# Patient Record
Sex: Male | Born: 1955 | Hispanic: Yes | Marital: Married | Smoking: Former smoker
Health system: Southern US, Community
[De-identification: ages and names within clinical notes are randomized; demographics above are authoritative.]

## PROBLEM LIST (undated history)

## (undated) DIAGNOSIS — E7800 Pure hypercholesterolemia, unspecified: Secondary | ICD-10-CM

## (undated) DIAGNOSIS — C61 Malignant neoplasm of prostate: Secondary | ICD-10-CM

## (undated) DIAGNOSIS — I10 Essential (primary) hypertension: Secondary | ICD-10-CM

## (undated) HISTORY — DX: Pure hypercholesterolemia, unspecified: E78.00

## (undated) HISTORY — DX: Malignant neoplasm of prostate: C61

## (undated) HISTORY — PX: CARDIAC CATHETERIZATION: SHX172

## (undated) HISTORY — DX: Essential (primary) hypertension: I10

---

## 2003-12-18 ENCOUNTER — Ambulatory Visit: Payer: Self-pay

## 2005-05-29 ENCOUNTER — Other Ambulatory Visit: Payer: Self-pay

## 2005-05-29 ENCOUNTER — Emergency Department: Payer: Self-pay | Admitting: Emergency Medicine

## 2005-06-01 ENCOUNTER — Other Ambulatory Visit: Payer: Self-pay

## 2005-06-01 ENCOUNTER — Emergency Department: Payer: Self-pay | Admitting: General Practice

## 2005-06-01 ENCOUNTER — Inpatient Hospital Stay: Payer: Self-pay | Admitting: Internal Medicine

## 2005-06-02 ENCOUNTER — Other Ambulatory Visit: Payer: Self-pay

## 2005-06-03 ENCOUNTER — Other Ambulatory Visit: Payer: Self-pay

## 2005-06-13 ENCOUNTER — Ambulatory Visit: Payer: Self-pay | Admitting: Internal Medicine

## 2005-06-15 ENCOUNTER — Inpatient Hospital Stay: Payer: Self-pay | Admitting: Internal Medicine

## 2005-06-15 ENCOUNTER — Other Ambulatory Visit: Payer: Self-pay

## 2006-03-26 ENCOUNTER — Ambulatory Visit: Payer: Self-pay | Admitting: Internal Medicine

## 2006-05-22 ENCOUNTER — Ambulatory Visit: Payer: Self-pay

## 2007-03-06 ENCOUNTER — Ambulatory Visit: Payer: Self-pay | Admitting: Urology

## 2007-03-06 ENCOUNTER — Other Ambulatory Visit: Payer: Self-pay

## 2007-03-20 ENCOUNTER — Ambulatory Visit: Payer: Self-pay | Admitting: Urology

## 2007-11-27 ENCOUNTER — Encounter: Payer: Self-pay | Admitting: Sports Medicine

## 2007-12-24 ENCOUNTER — Encounter: Payer: Self-pay | Admitting: Sports Medicine

## 2008-02-07 ENCOUNTER — Ambulatory Visit: Payer: Self-pay | Admitting: Urology

## 2008-10-06 ENCOUNTER — Ambulatory Visit: Payer: Self-pay | Admitting: Internal Medicine

## 2008-12-26 ENCOUNTER — Emergency Department: Payer: Self-pay | Admitting: Emergency Medicine

## 2009-01-20 ENCOUNTER — Ambulatory Visit: Payer: Self-pay | Admitting: General Practice

## 2009-01-29 ENCOUNTER — Ambulatory Visit: Payer: Self-pay | Admitting: General Practice

## 2009-11-19 ENCOUNTER — Ambulatory Visit: Payer: Self-pay | Admitting: Otolaryngology

## 2011-05-09 ENCOUNTER — Observation Stay: Payer: Self-pay | Admitting: Internal Medicine

## 2011-05-09 LAB — CBC
HCT: 44.7 % (ref 40.0–52.0)
MCH: 31 pg (ref 26.0–34.0)
MCHC: 33.2 g/dL (ref 32.0–36.0)
Platelet: 114 10*3/uL — ABNORMAL LOW (ref 150–440)
RDW: 14.2 % (ref 11.5–14.5)
WBC: 4.3 10*3/uL (ref 3.8–10.6)

## 2011-05-09 LAB — COMPREHENSIVE METABOLIC PANEL
Albumin: 4 g/dL (ref 3.4–5.0)
Alkaline Phosphatase: 66 U/L (ref 50–136)
Calcium, Total: 8.4 mg/dL — ABNORMAL LOW (ref 8.5–10.1)
Co2: 25 mmol/L (ref 21–32)
Creatinine: 0.6 mg/dL (ref 0.60–1.30)
EGFR (Non-African Amer.): >60
Osmolality: 281 (ref 275–301)
Potassium: 3.7 mmol/L (ref 3.5–5.1)
SGPT (ALT): 37 U/L
Total Protein: 6.8 g/dL (ref 6.4–8.2)

## 2011-05-09 LAB — CK TOTAL AND CKMB (NOT AT ARMC)
CK, Total: 285 U/L — ABNORMAL HIGH (ref 35–232)
CK-MB: 4.4 ng/mL — ABNORMAL HIGH (ref 0.5–3.6)

## 2011-05-09 LAB — TROPONIN I: Troponin-I: <0.02 ng/mL

## 2011-05-09 LAB — HEPATIC FUNCTION PANEL A (ARMC): Bilirubin, Direct: 0.1 mg/dL (ref 0.00–0.20)

## 2011-05-17 ENCOUNTER — Ambulatory Visit: Payer: Self-pay | Admitting: Internal Medicine

## 2012-05-15 DIAGNOSIS — N411 Chronic prostatitis: Secondary | ICD-10-CM | POA: Insufficient documentation

## 2012-05-15 DIAGNOSIS — N529 Male erectile dysfunction, unspecified: Secondary | ICD-10-CM | POA: Insufficient documentation

## 2012-05-15 DIAGNOSIS — N401 Enlarged prostate with lower urinary tract symptoms: Secondary | ICD-10-CM | POA: Insufficient documentation

## 2012-05-16 DIAGNOSIS — N35919 Unspecified urethral stricture, male, unspecified site: Secondary | ICD-10-CM | POA: Insufficient documentation

## 2012-05-16 DIAGNOSIS — N3941 Urge incontinence: Secondary | ICD-10-CM | POA: Insufficient documentation

## 2012-06-11 ENCOUNTER — Ambulatory Visit: Payer: Self-pay | Admitting: Gastroenterology

## 2013-07-15 IMAGING — CR DG CHEST 2V
1 series · 3 of 3 positions shown · non-contrast
Comparison: none

REASON FOR EXAM: pain
COMMENTS:

PROCEDURE:     DXR - DXR CHEST PA (OR AP) AND LATERAL  - May 09, 2011  [DATE]
RESULT:     Comparison: None

[Series 1: pa · 0.17mm/px · 3 of 3 slices shown]
[im 1/3]
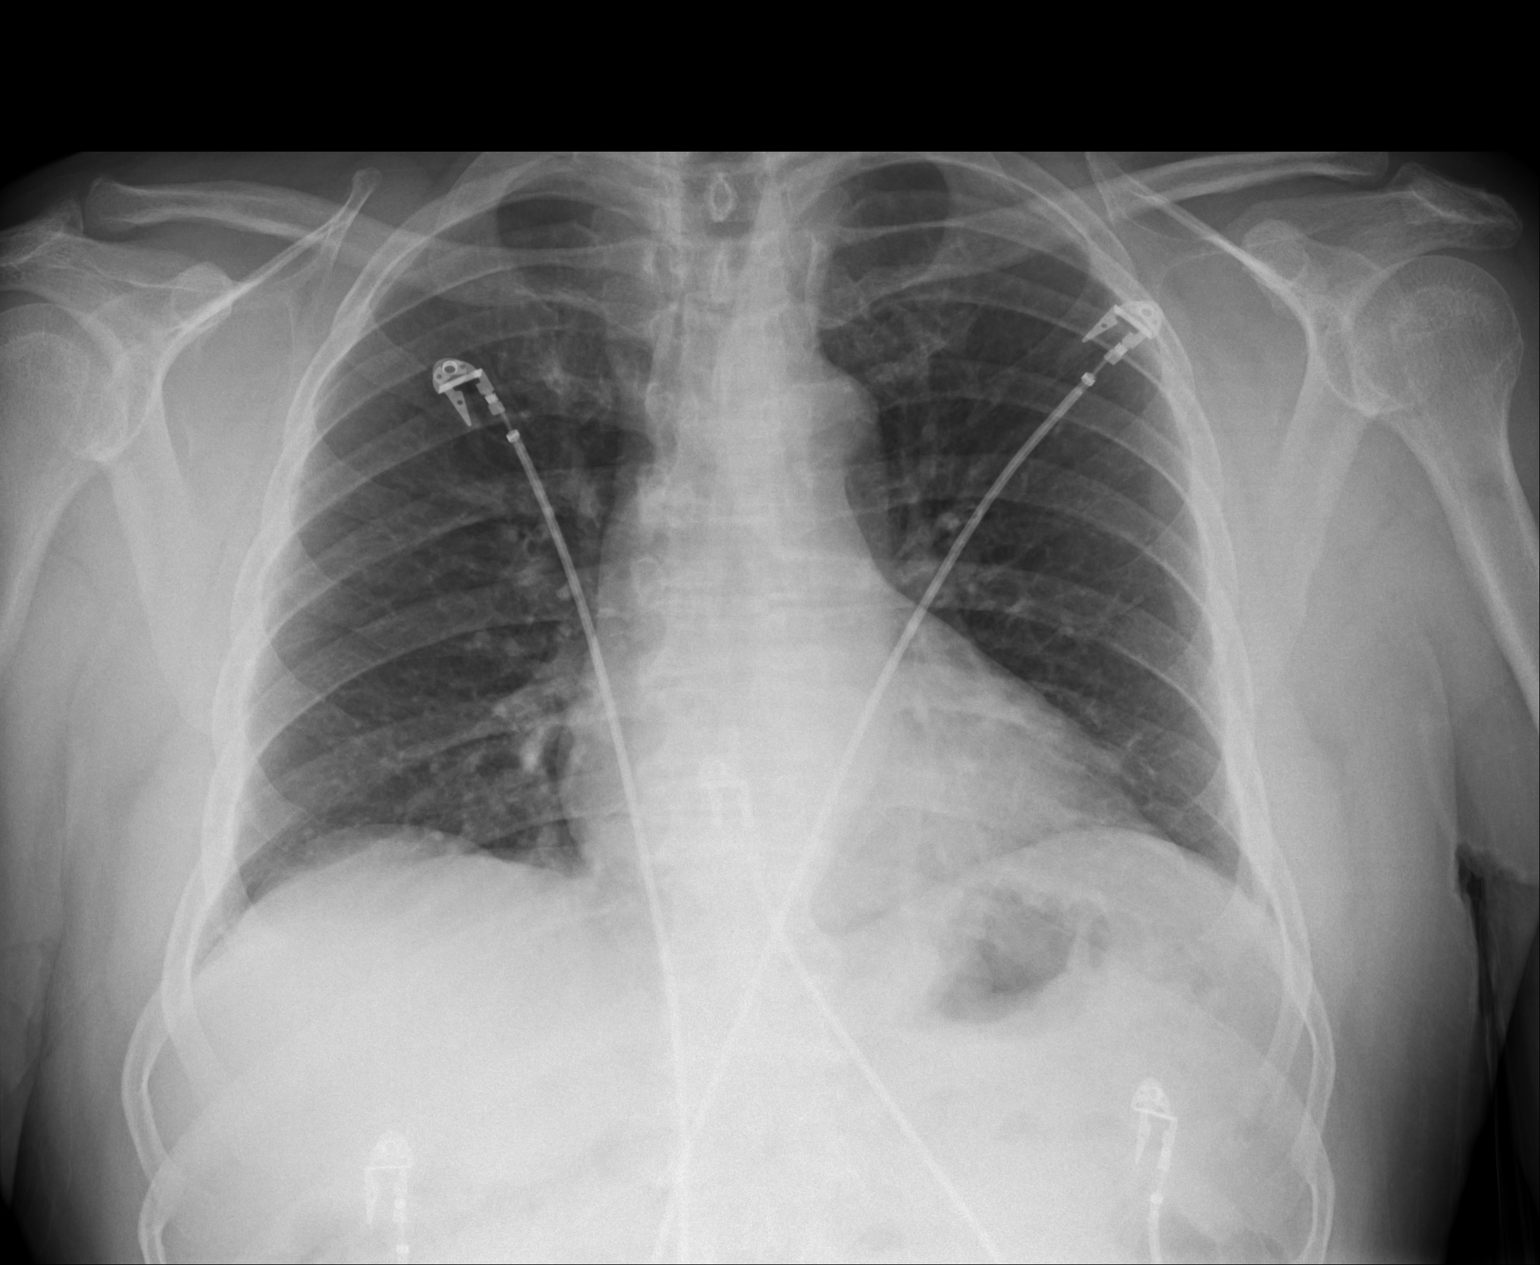
[im 2/3]
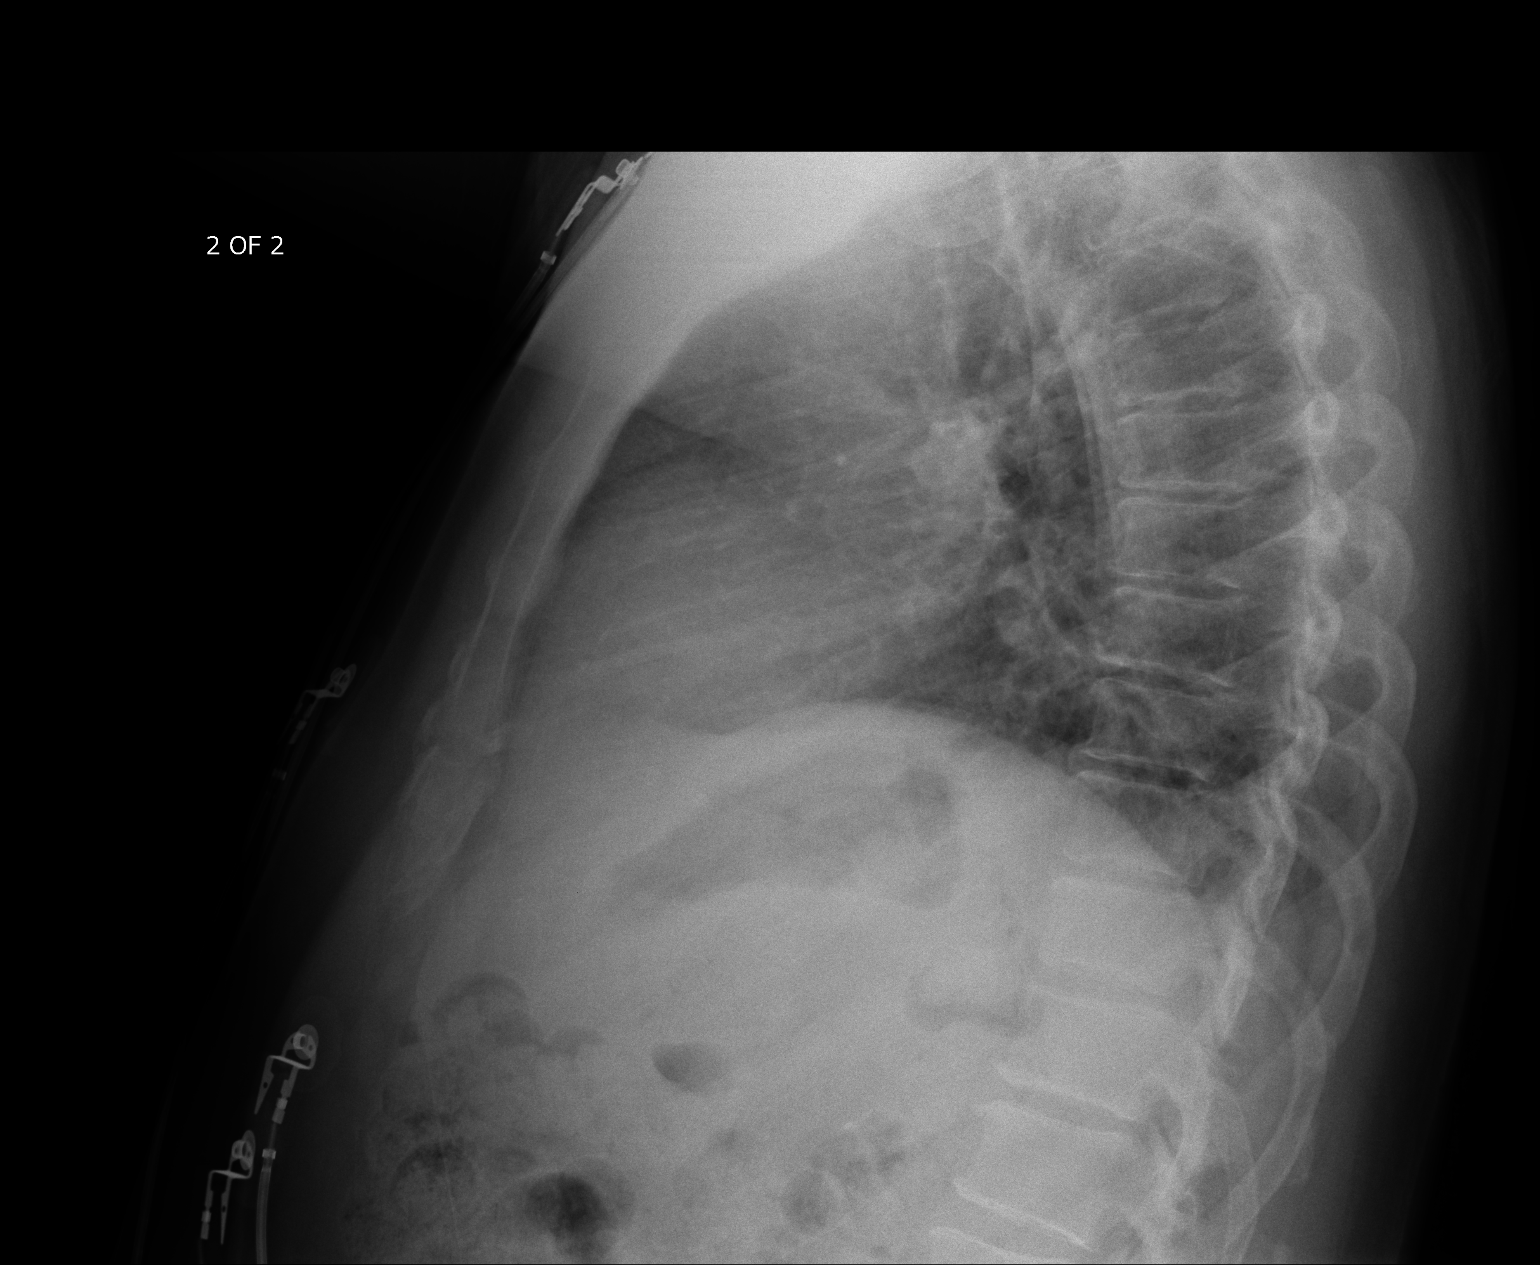
[im 3/3]
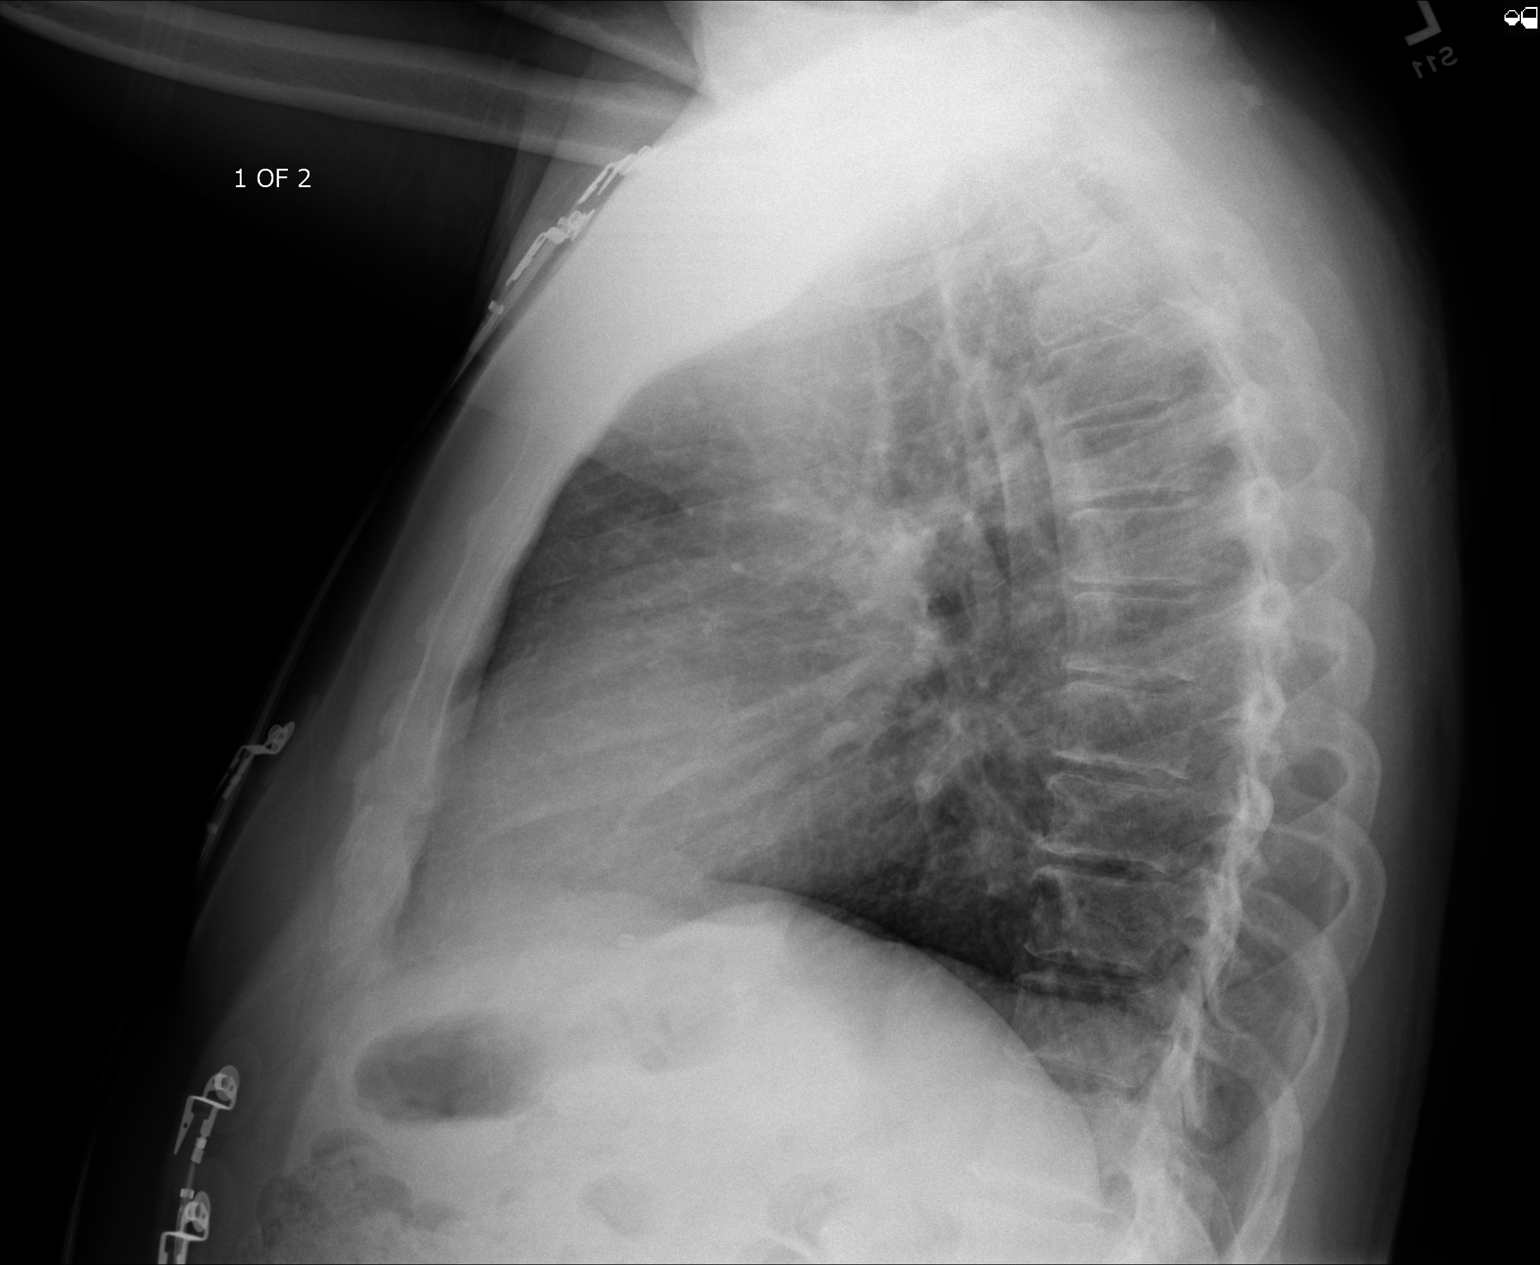

[3 of 3 positions shown; findings below may reference images not displayed]

FINDINGS: PA and lateral chest radiographs are provided.  There is no focal
parenchymal opacity, pleural effusion, or pneumothorax. The heart and
mediastinum are unremarkable.  The osseous structures are unremarkable.
IMPRESSION: No acute disease of the che[REDACTED]

## 2013-10-08 DIAGNOSIS — M179 Osteoarthritis of knee, unspecified: Secondary | ICD-10-CM | POA: Insufficient documentation

## 2013-10-08 DIAGNOSIS — M1710 Unilateral primary osteoarthritis, unspecified knee: Secondary | ICD-10-CM | POA: Insufficient documentation

## 2013-11-20 DIAGNOSIS — C61 Malignant neoplasm of prostate: Secondary | ICD-10-CM | POA: Insufficient documentation

## 2013-11-26 DIAGNOSIS — E559 Vitamin D deficiency, unspecified: Secondary | ICD-10-CM | POA: Insufficient documentation

## 2013-11-26 DIAGNOSIS — C61 Malignant neoplasm of prostate: Secondary | ICD-10-CM | POA: Insufficient documentation

## 2013-11-26 DIAGNOSIS — R7989 Other specified abnormal findings of blood chemistry: Secondary | ICD-10-CM | POA: Insufficient documentation

## 2014-01-29 ENCOUNTER — Ambulatory Visit: Payer: Self-pay | Admitting: Radiation Oncology

## 2014-02-23 ENCOUNTER — Ambulatory Visit: Payer: Self-pay | Admitting: Radiation Oncology

## 2014-03-24 ENCOUNTER — Ambulatory Visit
Admit: 2014-03-24 | Disposition: A | Discharge status: Home / Self Care | Payer: Self-pay | Attending: Radiation Oncology | Admitting: Radiation Oncology

## 2014-03-30 DIAGNOSIS — R739 Hyperglycemia, unspecified: Secondary | ICD-10-CM | POA: Insufficient documentation

## 2014-03-30 DIAGNOSIS — E7800 Pure hypercholesterolemia, unspecified: Secondary | ICD-10-CM | POA: Insufficient documentation

## 2014-04-24 ENCOUNTER — Ambulatory Visit
Admit: 2014-04-24 | Disposition: A | Discharge status: Home / Self Care | Payer: Self-pay | Attending: Radiation Oncology | Admitting: Radiation Oncology

## 2014-05-17 NOTE — Discharge Summary (Signed)
PATIENT NAME:  Ricky Avery, Ricky Avery MR#:  [id number] DATE OF BIRTH:  30-Oct-1955  DATE OF ADMISSION:  05/09/2011 DATE OF DISCHARGE:  05/10/2011  DISCHARGE DIAGNOSES:  1. Chest pain, myocardial infarction ruled out.  2. History of coronary artery disease, status post stent.  3. Hypertension.  4. Diet controlled diabetes.   CHIEF COMPLAINT: Chest pain.   HISTORY OF PRESENT ILLNESS: Makhi Muzquiz is a 59 year old male with a history of hypertension, type II diabetes diet controlled, and coronary artery disease who presented to the ER complaining of chest pain that started in the middle of his chest and described it as tight, pressure-like, intermittent, no radiation. The patient states that the pain came on after he had dinner. It was also associated with some mild shortness of breath but denies any headache or dizziness. No palpitations. No orthopnea.   PAST MEDICAL HISTORY:  1. Hypertension.  2. Coronary artery disease status post stent placement in 2007 by Dr. Clayborn Bigness. 3. Diet controlled diabetes.   PHYSICAL EXAMINATION: The patient was not in distress. Alert and oriented. Afebrile. Blood pressure 146/98, pulse 59, oxygen sat 97% on room air. HEENT: Normocephalic, atraumatic. HEART: S1, S2. LUNGS: Clear to auscultation. ABDOMEN: Soft, nontender. EXTREMITIES: No edema. NEUROLOGIC: Nonfocal.   LABORATORY, DIAGNOSTIC, AND RADIOLOGICAL DATA: Glucose 122, BUN 13, creatinine 0.6, sodium 140, potassium 3.7, chloride 106, bicarb 25. CK 285. MB 4.4. Troponin less than 0.02. D-dimer 0.23. WBC count 4.3, hemoglobin 14.9, platelets 114. Chest x-ray was normal. EKG showed sinus rhythm with no acute changes.   HOSPITAL COURSE: The patient was admitted to the Observation Unit and cardiac enzymes were cycled. The patient essentially ruled out for MI. He was placed on aspirin, Lopressor, and continued on his lisinopril. During his stay in the hospital, the patient did not have any further episodes of chest pain. He was  seen by Dr. Clayborn Bigness who agreed to follow him up in the clinic. The patient was stable at the time of discharge.   DISCHARGE MEDICATIONS: 1. Metoprolol 25 mg once a day.  2. Simvastatin 40 mg once a day.  3. Aspirin 81 mg daily.   4. Lisinopril 10 mg p.o. daily.   FOLLOW-UP: The patient has been advised to follow-up with me, Dr. Ginette Pitman, in 1 to 2 weeks. He was advised to call back if he has any further episodes of chest pain.   ____________________________ Tracie Harrier, MD vh:drc D: 05/17/2011 13:09:34 ET T: 05/17/2011 13:30:08 ET JOB#: [id number]  cc: Tracie Harrier, MD, <Dictator> Tracie Harrier MD ELECTRONICALLY SIGNED 06/08/2011 13:18

## 2014-05-17 NOTE — H&P (Signed)
PATIENT NAME:  Ricky Avery, HAMMITT MR#:  [id number] DATE OF BIRTH:  May 05, 1955  DATE OF ADMISSION:  05/09/2011  PRIMARY CARE PHYSICIAN:  Dr. Ginette Pitman   REFERRING PHYSICIAN:  Dr. Lovena Le   CHIEF COMPLAINT: Chest pain since last night.   HISTORY OF PRESENT ILLNESS: The patient is a 59 year old male with a history of hypertension, diabetes, and coronary artery disease presenting to the Emergency Department with chest pain since last night, which is in the middle part of the chest, tight, pressure-like, no radiation, intermittent.  No exacerbating factors.  The patient said he started to have chest pain after dinner last night.  He also had some mild shortness of breath but denies any headache or dizziness. No palpitations. No orthopnea. No nocturnal dyspnea. No leg edema. No cough, sputum, or hemoptysis. The patient's first set troponin is negative. EKG is normal. Chest x-ray is normal. He was admitted for observation.   PAST MEDICAL HISTORY:  1. Hypertension.  2. Diabetes.  3. Coronary artery disease status post stent in 2007 by Dr. Clayborn Bigness.   FAMILY HISTORY: Positive for hypertension, diabetes.   SOCIAL HISTORY: The patient is working at [employer] in environmental services. He quit smoking six years ago. Denies any alcohol drinking or illicit drugs.   REVIEW OF SYSTEMS: CONSTITUTIONAL: The patient denies any fever or chills. No headache or dizziness. No weakness. No weight loss. HEENT:  No double vision, blurred vision,  postnasal drip, or epistaxis.  CARDIOVASCULAR: Positive for chest pain, but no orthopnea or nocturnal dyspnea. No palpitations. No leg edema. PULMONARY:  Mild shortness of breath. No cough, sputum, or hematemesis. GI: No abdominal pain, nausea, vomiting, or diarrhea. No melena or bloody stool. GU: No dysuria or hematuria. No incontinence. MUSCULOSKELETAL: No joint pain or edema. HEMATOLOGY: No easy bruising or bleeding. ENDOCRINE: No polyuria or polydipsia. No heat or  cold intolerance. SKIN: No rash or jaundice. NEUROLOGY: No syncope, loss of consciousness, or seizure.   PHYSICAL EXAMINATION:  VITAL SIGNS: Temperature 96, blood pressure 146/98, pulse 59, oxygen saturation 97%.   GENERAL: The patient is alert, awake, oriented, in no acute distress.   HEENT: Pupils are round, equal, and reactive to light and accommodation. Moist oral mucosa. Clear oropharynx.   NECK: Supple. No JVD or carotid bruits. No lymphadenopathy. No thyromegaly.   CARDIOVASCULAR: S1, S2, regular rate and rhythm. No murmurs or gallops. No chest wall tenderness.   PULMONARY: Bilateral air entry. No wheezing or rales. No use of accessory muscles to breathe.   ABDOMEN: Soft. No distention or tenderness. Bowel sounds present. No organomegaly.   EXTREMITIES: No edema, clubbing, or cyanosis. No calf tenderness. Strong bilateral pedal pulses.   SKIN: No rash or jaundice.   NEUROLOGY: Alert and oriented times three. No focal deficits. Deep tendon reflexes 2+. Sensation intact. Power five out of five.   LABS/STUDIES: Glucose 122, BUN 13, creatinine 0.6, sodium 140, potassium 3.7, chloride 106, bicarbonate 25, CK 285, CK-MB 4.4, troponin less than 0.02. D-dimer 0.23. WBC 4.3, hemoglobin 14.9, platelets 114. Chest x-ray unremarkable. EKG sinus rhythm at 59.   IMPRESSION:  1. Chest pain, possible angina. Rule out ACS.  2. Coronary artery disease status post stent.  3. Hypertension, uncontrolled.  4. Diabetes.   PLAN OF TREATMENT:  1. The patient will be placed for observation. We will continue O2 by nasal cannula. Telemonitor. Aspirin, Lopressor, lisinopril, and Zocor. We will get a cardiology consult from Dr. Clayborn Bigness, who is his cardiologist. 2. For diabetes we will  start sliding scale.  3. GI and deep vein thrombosis prophylaxis.  4. I discussed with the patient and the patient's wife about the patient's situation and the plan of treatment.   TIME SPENT: About 50 minutes.    ____________________________ Demetrios Loll, MD qc:bjt D: 05/09/2011 16:24:23 ET T: 05/09/2011 17:22:08 ET JOB#: [id number]  cc: Demetrios Loll, MD, <Dictator> Tracie Harrier, MD Demetrios Loll MD ELECTRONICALLY SIGNED 05/16/2011 6:11

## 2014-05-24 NOTE — Consult Note (Signed)
Reason for Visit: This 59 year old Male patient presents to the clinic for initial evaluation of  prostate cancer .   Referred by Dr.Hande.  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 Not sure of the acceleration of his PSA although transrectal ultrasound-guided biopsy was performed showing a Gleason 7 (3+4) in 1 core out of 6 taken. Patient has very little urinary symptoms. No significant urgency frequency nocturia or incontinence. Bowel movements are normal. He has no bone pain. Patient was evaluated at [facility] and option of robotic prostatectomy versus radiation therapy was made. He is opted to go ahead with radiation therapy and would like it to be delivered locally and is seen today for opinion and consultation.  Past Hx:    high cholesterol:    hypertension:    cardiac catheterization:   Past, Family and Social History:  Past Medical History positive   Cardiovascular cardiac catheterization; hyperlipidemia; hypertension   Gastrointestinal GERD   Past Medical History Comments arthritis   Family History positive   Family History Comments them a history of adult-onset diabetes   Social History positive   Social History Comments 30 pack year smoking history has quit no EtOH abuse history   Additional Past Medical and Surgical History seen accompanied by interpreter today   Allergies:   No Known Allergies:   Home Meds:  Home Medications: Medication Instructions Status  metoprolol tartrate 25 mg oral tablet 1  tab(s) orally once a day Active  simvastatin 40 mg oral tablet 1 tab(s) orally once a day Active  aspirin 81 mg oral tablet 1 tab(s) orally once a day Active  lisinopril 10 mg oral tablet 1 tab(s) orally once a day Active  tamsulosin 0.4 mg oral capsule 1 cap(s) orally once a day Active  Viagra 100 mg oral tablet 1 tab(s) orally once a day, As Needed Active   Review of Systems:  General negative   Performance Status (ECOG) 0   Skin negative   Breast negative   Ophthalmologic negative   ENMT negative   Respiratory and Thorax negative   Cardiovascular negative   Gastrointestinal negative   Genitourinary negative   Musculoskeletal negative   Neurological negative   Psychiatric negative   Hematology/Lymphatics negative   Endocrine negative   Allergic/Immunologic negative   Physical Exam:  General/Skin/HEENT:  General normal   Skin normal   Eyes normal   ENMT normal   Head and Neck normal   Additional PE well-developed male in NAD. Lungs are clear to A&P cardiac examination shows regular rate and rhythm. Abdomen is benign. On rectal exam rectal stricture tone is good prostate is small approximately 30 ml sulcus is preserved bilaterally. No evidence of mass or nodularity is identified. No other rectal abnormality is noted.   Breasts/Resp/CV/GI/GU:  Respiratory and Thorax normal   Cardiovascular normal   Gastrointestinal normal   Genitourinary normal   MS/Neuro/Psych/Lymph:  Musculoskeletal normal   Neurological normal   Lymphatics normal   Assessment and Plan: Impression:   stage I (  Gleason 7 (3+4) adenocarcinoma of the prostate in 59 year old male desiring radiation therapy treatment. Plan:   at this time and gone over treatment options with the patient through the interpreter. Both radical prostatectomy external beam radiation and seed implantation were discussed.patient is interested in external beam image guided radiation therapy. He would  like to continue to work while he is undergoing external beam treatment. I have arranged for Dr. Erlene Quan to place gold fiduciary markers in his prostate for daily image guided treatment. I would plan on delivering 8000 cGy to his prostate using IM RREATMENT PLANNING AND DELIVERY> Risks and benefits of treatment including increased lower urinary tract symptoms, possible diarrhea, possible fatigue, possible alteration of blood counts all were described in detail to the patient. He seems to comprehend my treatment plan well. Arrangements for marker placements have been done. I've also ordered and scheduled a CT simulation shortly after the markers are placed.  I would like to take this opportunity for allowing me to participate in the care of your patient..  Fax to Physician:  Physicians To Recieve Fax: Ricky Harrier, MD - [phone] Sherlynn Stalls - [phone].  Electronic Signatures: Ricky Avery (MD)  (Signed 07-Jan-16 12:19)  Authored: HPI, Diagnosis, Past Hx, PFSH, Allergies, Home Meds, ROS, Physical Exam, Encounter Assessment and Plan, Fax to Physician   Last Updated: 07-Jan-16 12:19 by Ricky Avery (MD)

## 2014-06-05 ENCOUNTER — Other Ambulatory Visit: Payer: Self-pay | Admitting: *Deleted

## 2014-06-05 DIAGNOSIS — C61 Malignant neoplasm of prostate: Secondary | ICD-10-CM

## 2014-06-11 ENCOUNTER — Encounter: Payer: Self-pay | Admitting: Radiation Oncology

## 2014-06-11 ENCOUNTER — Other Ambulatory Visit: Payer: Self-pay

## 2014-06-11 ENCOUNTER — Other Ambulatory Visit: Payer: Self-pay | Admitting: *Deleted

## 2014-06-11 ENCOUNTER — Ambulatory Visit
Admission: RE | Admit: 2014-06-11 | Discharge: 2014-06-11 | Disposition: A | Discharge status: Home / Self Care | Payer: 59 | Source: Ambulatory Visit | Attending: Radiation Oncology | Admitting: Radiation Oncology

## 2014-06-11 VITALS — BP 153/92 | HR 64 | Temp 96.6°F | Resp 20 | Wt 163.6 lb

## 2014-06-11 DIAGNOSIS — C61 Malignant neoplasm of prostate: Secondary | ICD-10-CM

## 2014-06-11 NOTE — Progress Notes (Signed)
Radiation Oncology Follow up Note  Name: Ricky Avery   Date:   06/11/2014 MRN:  [id number] DOB: 1955/10/07    This 59 y.o. male presents to the clinic today fopatient is a 59 year old employee of [employer] who presented with a PSA of 3.7. Not sure of the acceleration of his PSA although transrectal ultrasound-guided biopsy was performed showing a Gleason 7 (3+4) in 1 core out of 6 taken. Patient has very little urinary symptoms. No significant urgency frequency nocturia or incontinence. Bowel movements are normal. He has no bone pain. Patient was evaluated at [facility] and option of robotic prostatectomy versus radiation therapy was made. He is opted to go ahead with radiation therapyr he underwent image guided radiation therapy to 8000 cGy. He is seen today one month out and is doing well. Very little urinary symptoms except occasional burning. He is discontinuing his Flomax on his own has really very little nocturia. No bowel complications  REFERRING PROVIDER: No ref. provider found  HPI: As above.  COMPLICATIONS OF TREATMENT: none  FOLLOW UP COMPLIANCE: keeps appointments   PHYSICAL EXAM:  BP 153/92 mmHg  Pulse 64  Temp(Src) 96.6 F (35.9 C)  Resp 20  Wt 163 lb 9.3 oz (74.2 kg) On rectal exam rectal sphincter tone is good. Prostate is smooth contracted without evidence of nodularity or mass. Sulcus is preserved bilaterally. No discrete nodularity is identified. No other rectal abnormalities are noted. Macro physical exam  RADIOLOGY RESULTS: No radiology studies to review  PLAN: At the present time he is doing well with very little side effect profile from his prior image guided radiation therapy. I'm please was overall progress. Of explained to him our PSA protocol and asked to see him back in 4 months for follow-up. His PSA has not been performed we'll do one at that time which I've ordered. Patient is to call with any concerns.  I would like to take this opportunity  for allowing me to participate in the care of your patient.Armstead Peaks., MD

## 2014-07-14 ENCOUNTER — Telehealth: Payer: Self-pay | Admitting: Urology

## 2014-07-14 DIAGNOSIS — N529 Male erectile dysfunction, unspecified: Secondary | ICD-10-CM

## 2014-07-14 NOTE — Telephone Encounter (Signed)
Patient called with an interpreter today.  He is requesting a prescription for Cialis 20mg  (instead of Viagara) due to the lower cost.  He also is requesting a new prescription for Flomax.    Pharmacy:  [place]

## 2014-07-15 NOTE — Telephone Encounter (Signed)
Patient may have the Cialis 20 mg one tablet daily #6 with 12 refills.

## 2014-07-15 NOTE — Telephone Encounter (Signed)
Dr. Erlene Quan last saw the patient in January and referred him to the cancer center.  We do not have

## 2014-07-16 MED ORDER — TADALAFIL 20 MG PO TABS: 20.0000 mg | ORAL_TABLET | Freq: Every day | ORAL | Status: DC | PRN

## 2014-07-16 NOTE — Telephone Encounter (Signed)
Cialis called into pt pharmacy. Cw,lpn

## 2014-07-30 ENCOUNTER — Telehealth: Payer: Self-pay | Admitting: Urology

## 2014-07-30 DIAGNOSIS — N400 Enlarged prostate without lower urinary tract symptoms: Secondary | ICD-10-CM

## 2014-07-30 DIAGNOSIS — N529 Male erectile dysfunction, unspecified: Secondary | ICD-10-CM

## 2014-07-30 MED ORDER — TAMSULOSIN HCL 0.4 MG PO CAPS: 0.4000 mg | ORAL_CAPSULE | Freq: Every day | ORAL | Status: DC

## 2014-07-30 MED ORDER — TADALAFIL 20 MG PO TABS: 20.0000 mg | ORAL_TABLET | Freq: Every day | ORAL | Status: DC | PRN

## 2014-07-30 NOTE — Telephone Encounter (Signed)
Patient's medication for Flomax and Cialis was not sent in to [facility] [phone] (Phone) & [phone] (Fax) ) Best contact [phone].  07/30/14 MAF

## 2014-07-30 NOTE — Telephone Encounter (Signed)
From previous note from [place] [facility] was refaxed to pharmacy. Per Dr. Erlene Quan today flomax was also faxed over. Cw,lpn

## 2014-08-04 ENCOUNTER — Ambulatory Visit (INDEPENDENT_AMBULATORY_CARE_PROVIDER_SITE_OTHER): Payer: 59 | Admitting: Urology

## 2014-08-04 ENCOUNTER — Encounter: Payer: Self-pay | Admitting: Urology

## 2014-08-04 VITALS — BP 117/79 | HR 66 | Ht 65.0 in | Wt 162.0 lb

## 2014-08-04 DIAGNOSIS — R351 Nocturia: Secondary | ICD-10-CM | POA: Diagnosis not present

## 2014-08-04 DIAGNOSIS — C61 Malignant neoplasm of prostate: Secondary | ICD-10-CM | POA: Diagnosis not present

## 2014-08-04 DIAGNOSIS — N528 Other male erectile dysfunction: Secondary | ICD-10-CM | POA: Diagnosis not present

## 2014-08-04 LAB — URINALYSIS, COMPLETE
Bilirubin, UA: NEGATIVE
Glucose, UA: NEGATIVE
Leukocytes, UA: NEGATIVE
Nitrite, UA: NEGATIVE
PH UA: 5.5 (ref 5.0–7.5)
RBC UA: NEGATIVE
Specific Gravity, UA: >1.03 — ABNORMAL HIGH (ref 1.005–1.030)
UUROB: 0.2 mg/dL (ref 0.2–1.0)

## 2014-08-04 LAB — MICROSCOPIC EXAMINATION

## 2014-08-04 LAB — BLADDER SCAN AMB NON-IMAGING

## 2014-08-04 NOTE — Progress Notes (Signed)
08/04/2014 10:49 AM   Ricky Avery [birth year] [id number]  Referring provider: Tracie Harrier, MD [place], [place] [id number]  Chief Complaint  Patient presents with  . Prostate Cancer    54month follow up    HPI: 59year--old male diagnosed with stage I Gleason 3+4 prostate cancer involving 1 out of 6 core biopsies in 10/2013 . His presenting PSA was 3.7. Prostate size 39.6 cc.  Prior PSA data is not currently available and PSA velocity is unknown.  He has since been treated with external beam radiation therapy by Dr. Noreene Filbert at the [place] [facility].  During radiation, he had worsening urinary frequency but this has improved since the teatment has ben completed.  Unfortunately, he has not been able to take the Flomax for the past 3 months as well as Cialis mg due to some confusion in the pharmacy.  He does with this medication helped his urinary symptoms significantly specifically nocturia. He would like to restart this medication of possible.  Urinary tract infections or gross hematuria.  He does have baseline ED he took Viagra 100 mg with good success. He was never able to fill a Cialis prescription last visit.[name]  He PSA is due in in 09/2014 followed by the cancer center s/p radiation.        IPSS      08/04/14 0900       International Prostate Symptom Score   How often have you had the sensation of not emptying your bladder? Almost always     How often have you had to urinate less than every two hours? Less than half the time     How often have you found you stopped and started again several times when you urinated? Less than half the time     How often have you found it difficult to postpone urination? Not at All     How often have you had a weak urinary stream? Not at All     How often have you had to strain to start urination? Not at All     How many times did you typically get up at night to urinate? 2 Times     Total IPSS  Score 11     Quality of Life due to urinary symptoms   If you were to spend the rest of your life with your urinary condition just the way it is now how would you feel about that? Mostly Satisfied          PMH: Past Medical History  Diagnosis Date  . Prostate cancer   . Hypertension   . Hypercholesteremia     Surgical History: Past Surgical History  Procedure Laterality Date  . Cardiac catheterization      Home Medications:    Medication List       This list is accurate as of: 08/04/14 10:49 AM.  Always use your most recent med list.               aspirin 81 MG tablet  Take 81 mg by mouth daily.     lisinopril 2.5 MG tablet  Commonly known as:  PRINIVIL,ZESTRIL  Take 2.5 mg by mouth daily.     metoprolol succinate 25 MG 24 hr tablet  Commonly known as:  TOPROL-XL  Take 25 mg by mouth daily.     simvastatin 40 MG tablet  Commonly known as:  ZOCOR  Take 40 mg by mouth daily.  tadalafil 20 MG tablet  Commonly known as:  CIALIS  Take 1 tablet (20 mg total) by mouth daily as needed for erectile dysfunction.     tamsulosin 0.4 MG Caps capsule  Commonly known as:  FLOMAX  Take 1 capsule (0.4 mg total) by mouth daily.        Allergies:  Allergies  Allergen Reactions  . No Known Allergies     Family History: No family history on file.  Social History:  reports that he has quit smoking. He has never used smokeless tobacco. His alcohol and drug histories are not on file.   Review of Systems UROLOGY Frequent Urination?: No Hard to postpone urination?: No Burning/pain with urination?: Yes Get up at night to urinate?: No Leakage of urine?: No Urine stream starts and stops?: Yes Trouble starting stream?: No Do you have to strain to urinate?: Yes Blood in urine?: No Urinary tract infection?: No Sexually transmitted disease?: No Injury to kidneys or bladder?: No Painful intercourse?: No Weak stream?: No Erection problems?: No Penile pain?:  No Gastrointestinal Nausea?: No Vomiting?: No Indigestion/heartburn?: Yes Diarrhea?: No Constipation?: No Constitutional Fever: No Night sweats?: No Weight loss?: No Fatigue?: No Skin Skin rash/lesions?: No Itching?: No Eyes Blurred vision?: No Double vision?: No Ears/Nose/Throat Sore throat?: No Sinus problems?: No Hematologic/Lymphatic Swollen glands?: No Easy bruising?: No Cardiovascular Leg swelling?: No Chest pain?: No Respiratory Cough?: No Shortness of breath?: No Endocrine Excessive thirst?: No Musculoskeletal Back pain?: No Joint pain?: No Neurological Headaches?: No Dizziness?: No Psychologic Depression?: No Anxiety?: No   Physical Exam: BP 117/79 mmHg  Pulse 66  Ht 5\' 5"  (1.651 m)  Wt 162 lb (73.483 kg)  BMI 26.96 kg/m2  Constitutional:  Alert and oriented, No acute distress.  Spanish speaking, presents with translator today. HEENT: [place] AT, moist mucus membranes.  Trachea midline, no masses. Cardiovascular: No clubbing, cyanosis, or edema. Respiratory: Normal respiratory effort, no increased work of breathing. Skin: No rashes, bruises or suspicious lesions. Neurologic: Grossly intact, no focal deficits, moving all 4 extremities. Psychiatric: Normal mood and affect.  Laboratory Data: Lab Results  Component Value Date   WBC 4.3 05/09/2011   HGB 14.9 05/09/2011   HCT 44.7 05/09/2011   MCV 93 05/09/2011   PLT 114* 05/09/2011    Lab Results  Component Value Date   CREATININE 0.60 05/09/2011     Urinalysis Results for orders placed or performed in visit on 08/04/14  Microscopic Examination  Result Value Ref Range   WBC, UA 0-5 0 -  5 /hpf   RBC, UA 0-2 0 -  2 /hpf   Epithelial Cells (non renal) 0-10 0 - 10 /hpf   Mucus, UA Present (A) Not Estab.   Bacteria, UA Few None seen/Few  Urinalysis, Complete  Result Value Ref Range   Specific Gravity, UA >1.030 (H) 1.005 - 1.030   pH, UA 5.5 5.0 - 7.5   Color, UA Yellow Yellow    Appearance Ur Clear Clear   Leukocytes, UA Negative Negative   Protein, UA 1+ (A) Negative/Trace   Glucose, UA Negative Negative   Ketones, UA Trace (A) Negative   RBC, UA Negative Negative   Bilirubin, UA Negative Negative   Urobilinogen, Ur 0.2 0.2 - 1.0 mg/dL   Nitrite, UA Negative Negative   Microscopic Examination See below:   BLADDER SCAN AMB NON-IMAGING  Result Value Ref Range   Scan Result 18ml     Pertinent Imaging: PVR as above  Assessment & Plan:  A 59 year old male with immediate risk Gleason 3+4 prostate cancer diagnosed in 10 2015 who recently completed external beam radiation therapy (8000 cGy) with Dr. Baruch Gouty this spring.  Urinary symptoms mild, better on Flomax. We'll resume this medication. He also has a history of ED and would like to start back on his PD 5 inhibitors.  1. Prostate cancer PSA check due at the cancer center in 09/2014, will defer today - Urinalysis, Complete - BLADDER SCAN AMB NON-IMAGING  2. Nocturia Moderate voiding symptoms with nocturia as primary bother symptoms.  Better on Flomax.  PVR today minimal. -resume Flomax, called pharmacy to sort out misunderstanding   3. Other male erectile dysfunction Samples of Cialis 20 mg given today, will work on prior authorization   Return in about 6 months (around 02/04/2015).  Hollice Espy, MD  [facility] [address] [place], [place] [address] [phone]

## 2014-10-12 ENCOUNTER — Encounter: Payer: Self-pay | Admitting: Radiation Oncology

## 2014-10-12 ENCOUNTER — Inpatient Hospital Stay: Payer: 59 | Attending: Radiation Oncology

## 2014-10-12 ENCOUNTER — Ambulatory Visit
Admission: RE | Admit: 2014-10-12 | Discharge: 2014-10-12 | Disposition: A | Discharge status: Home / Self Care | Payer: 59 | Source: Ambulatory Visit | Attending: Radiation Oncology | Admitting: Radiation Oncology

## 2014-10-12 VITALS — BP 143/87 | HR 66 | Temp 97.0°F | Wt 157.8 lb

## 2014-10-12 DIAGNOSIS — C61 Malignant neoplasm of prostate: Secondary | ICD-10-CM | POA: Diagnosis not present

## 2014-10-12 DIAGNOSIS — Z923 Personal history of irradiation: Secondary | ICD-10-CM | POA: Insufficient documentation

## 2014-10-12 NOTE — Progress Notes (Signed)
Radiation Oncology Follow up Note  Name: Tanya Marvin   Date:   10/12/2014 MRN:  [id number] DOB: Mar 04, 1955    This 59 y.o. male presents to the clinic today for follow-up for prostate cancer.  REFERRING Shion Bluestein: Tracie Harrier, MD  HPI: Patient is a 59 year old male now out over 5 months having completedIMRT radiation therapy for a Gleason 7 (3+4) adenocarcinoma the prostate presenting the PSA of 3.7. Seen today in routine follow-up is doing fairly well. States he occasionally has some staining on his underwear after a bowel movement. Also complains of some slight dribbling. I've explained to him that all is very natural for a man his age. I have suggested some Keagle exercises. He specifically otherwise denies any other lower urinary tract symptoms or diarrhea. He had a PSA drawn today..  COMPLICATIONS OF TREATMENT: none  FOLLOW UP COMPLIANCE: keeps appointments   PHYSICAL EXAM:  BP 143/87 mmHg  Pulse 66  Temp(Src) 97 F (36.1 C)  Wt 157 lb 13.6 oz (71.6 kg) On rectal exam rectal sphincter tone is good. Prostate is smooth contracted without evidence of nodularity or mass. Sulcus is preserved bilaterally. No discrete nodularity is identified. No other rectal abnormalities are noted. Well-developed well-nourished patient in NAD. HEENT reveals PERLA, EOMI, discs not visualized.  Oral cavity is clear. No oral mucosal lesions are identified. Neck is clear without evidence of cervical or supraclavicular adenopathy. Lungs are clear to A&P. Cardiac examination is essentially unremarkable with regular rate and rhythm without murmur rub or thrill. Abdomen is benign with no organomegaly or masses noted. Motor sensory and DTR levels are equal and symmetric in the upper and lower extremities. Cranial nerves II through XII are grossly intact. Proprioception is intact. No peripheral adenopathy or edema is identified. No motor or sensory levels are noted. Crude visual fields are within normal  range.  RADIOLOGY RESULTS: No current films for review  PLAN: At the present time he is doing well I've explained to him again some of his symptoms are just natural aging. He will try some of the Keagle exercises. I will report his PSA separately. Otherwise I've asked to see him back in 6 months for follow-up. If PSA normalizes will start once your follow-up visits. Patient knows to call sooner with any concerns. He was accompanied today for an interpreter throughout the entire interview.  I would like to take this opportunity for allowing me to participate in the care of your patient.Armstead Peaks., MD

## 2014-10-13 LAB — PSA: PSA: 3.75 ng/mL (ref 0.00–4.00)

## 2015-02-05 ENCOUNTER — Encounter: Payer: Self-pay | Admitting: Urology

## 2015-02-05 ENCOUNTER — Ambulatory Visit (INDEPENDENT_AMBULATORY_CARE_PROVIDER_SITE_OTHER): Payer: 59 | Admitting: Urology

## 2015-02-05 VITALS — BP 148/82 | HR 67 | Ht 66.0 in | Wt 151.7 lb

## 2015-02-05 DIAGNOSIS — M1990 Unspecified osteoarthritis, unspecified site: Secondary | ICD-10-CM | POA: Insufficient documentation

## 2015-02-05 DIAGNOSIS — I10 Essential (primary) hypertension: Secondary | ICD-10-CM | POA: Insufficient documentation

## 2015-02-05 DIAGNOSIS — R351 Nocturia: Secondary | ICD-10-CM | POA: Diagnosis not present

## 2015-02-05 DIAGNOSIS — K219 Gastro-esophageal reflux disease without esophagitis: Secondary | ICD-10-CM | POA: Insufficient documentation

## 2015-02-05 DIAGNOSIS — Z8546 Personal history of malignant neoplasm of prostate: Secondary | ICD-10-CM

## 2015-02-05 DIAGNOSIS — N529 Male erectile dysfunction, unspecified: Secondary | ICD-10-CM | POA: Diagnosis not present

## 2015-02-05 MED ORDER — TADALAFIL 20 MG PO TABS: 20.0000 mg | ORAL_TABLET | Freq: Every day | ORAL | Status: DC | PRN

## 2015-02-05 MED ORDER — TAMSULOSIN HCL 0.4 MG PO CAPS: 0.4000 mg | ORAL_CAPSULE | Freq: Every day | ORAL | Status: DC

## 2015-02-05 NOTE — Progress Notes (Signed)
10:48 AM  02/05/2015  Ricky Avery 09-10-1955 [id number]  Referring provider: Tracie Harrier, MD [address] [facility] [place], [place] [address]  Chief Complaint  Patient presents with  . Prostate Cancer    6month    HPI: 60 year old male diagnosed with stage I Gleason 3+4 prostate cancer involving 1 out of 6 core biopsies in 10/2013 . His presenting PSA was 3.7. Prostate size 39.6 cc.  He underwent treatment with external beam radiation which was well tolerated. He is followed now by Dr. Noreene Filbert, radiation oncology, at the cancer center.  His most recent PSA was 3.75 on 09/2014 and is due for follow-up in March.  Today, he has no urinary complaints. He continues on Flomax which is controlling his urinary symptoms. He only gets up approximately once a week at nighttime now on Flomax. He denies any issues with weak stream, urinary urgency, frequency, dysuria, or gross hematuria.a.  He does have baseline ED he took Viagra 100 mg with good success.  Last visit, he was given Cialis 20 mg which is also highly effective without side effects.. He would like to continue this medication.  His PSA is due in in 03/2015 followed by the cancer center s/p radiation.        PMH: Past Medical History  Diagnosis Date  . Prostate cancer ([place])   . Hypertension   . Hypercholesteremia     Surgical History: Past Surgical History  Procedure Laterality Date  . Cardiac catheterization      Home Medications:    Medication List       This list is accurate as of: 02/05/15 10:48 AM.  Always use your most recent med list.               aspirin 81 MG tablet  Take 81 mg by mouth daily.     lisinopril 10 MG tablet  Commonly known as:  PRINIVIL,ZESTRIL     meloxicam 15 MG tablet  Commonly known as:  MOBIC     metoprolol succinate 25 MG 24 hr tablet  Commonly known as:  TOPROL-XL  Take 25 mg by mouth daily.     simvastatin 40 MG tablet  Commonly known as:   ZOCOR  Take 40 mg by mouth daily.     tadalafil 20 MG tablet  Commonly known as:  CIALIS  Take 1 tablet (20 mg total) by mouth daily as needed for erectile dysfunction.     tamsulosin 0.4 MG Caps capsule  Commonly known as:  FLOMAX  Take 1 capsule (0.4 mg total) by mouth daily.     traMADol 50 MG tablet  Commonly known as:  ULTRAM        Allergies:  Allergies  Allergen Reactions  . No Known Allergies     Family History: History reviewed. No pertinent family history.  Social History:  reports that he has quit smoking. He has never used smokeless tobacco. His alcohol and drug histories are not on file.   Review of Systems UROLOGY Frequent Urination?: No Hard to postpone urination?: No Burning/pain with urination?: No Get up at night to urinate?: Yes Leakage of urine?: Yes Urine stream starts and stops?: No Trouble starting stream?: No Do you have to strain to urinate?: No Blood in urine?: No Urinary tract infection?: No Sexually transmitted disease?: No Injury to kidneys or bladder?: No Painful intercourse?: No Weak stream?: No Erection problems?: No Penile pain?: No Gastrointestinal Nausea?: No Vomiting?: No Indigestion/heartburn?:  No Diarrhea?: No Constipation?: No Constitutional Fever: No Night sweats?: No Weight loss?: No Fatigue?: No Skin Skin rash/lesions?: No Itching?: No Eyes Blurred vision?: No Double vision?: No Ears/Nose/Throat Sore throat?: No Sinus problems?: No Hematologic/Lymphatic Swollen glands?: No Easy bruising?: No Cardiovascular Leg swelling?: No Chest pain?: No Respiratory Cough?: No Shortness of breath?: No Endocrine Excessive thirst?: No Musculoskeletal Back pain?: No Joint pain?: No Neurological Headaches?: No Dizziness?: No Psychologic Depression?: No Anxiety?: No   Physical Exam: BP 148/82 mmHg  Pulse 67  Ht 5\' 6"  (1.676 m)  Wt 151 lb 11.2 oz (68.811 kg)  BMI 24.50 kg/m2  Constitutional:  Alert  and oriented, No acute distress.  Spanish speaking, presents with translator today. HEENT: [place] AT, moist mucus membranes.  Trachea midline, no masses. Cardiovascular: No clubbing, cyanosis, or edema. Respiratory: Normal respiratory effort, no increased work of breathing. Skin: No rashes, bruises or suspicious lesions. Neurologic: Grossly intact, no focal deficits, moving all 4 extremities. Psychiatric: Normal mood and affect.  Laboratory Data: Lab Results  Component Value Date   WBC 4.3 05/09/2011   HGB 14.9 05/09/2011   HCT 44.7 05/09/2011   MCV 93 05/09/2011   PLT 114* 05/09/2011    Lab Results  Component Value Date   CREATININE 0.60 05/09/2011    Urinalysis Results for orders placed or performed in visit on 10/12/14  PSA  Result Value Ref Range   PSA 3.75 0.00 - 4.00 ng/mL    Assessment & Plan:  A 60 year old male with immediate risk Gleason 3+4 prostate cancer diagnosed in 10/2013 s/p EBRT (8000 cGy) with Dr. Baruch Gouty.    1. History of prostate cancer PSA check due at the cancer center 03/2015, will be followed there for now.  2. Nocturia Improved on Flomax. Continue indefinitely.  3. Other male erectile dysfunction Continue Cialis 20 mg   Return in about 1 year (around 02/05/2016) for f/u midlevel .  Hollice Espy, MD  [facility] [address] [place], [place] [address] [phone]

## 2015-03-19 ENCOUNTER — Ambulatory Visit: Payer: Self-pay | Admitting: Physician Assistant

## 2015-03-19 ENCOUNTER — Encounter: Payer: Self-pay | Admitting: Physician Assistant

## 2015-03-19 ENCOUNTER — Other Ambulatory Visit: Payer: Self-pay | Admitting: Physician Assistant

## 2015-03-19 VITALS — BP 130/90 | HR 72 | Temp 98.3°F

## 2015-03-19 DIAGNOSIS — B9789 Other viral agents as the cause of diseases classified elsewhere: Secondary | ICD-10-CM

## 2015-03-19 DIAGNOSIS — J988 Other specified respiratory disorders: Principal | ICD-10-CM

## 2015-03-19 MED ORDER — KETOROLAC TROMETHAMINE 60 MG/2ML IM SOLN: 60.0000 mg | Freq: Once | INTRAMUSCULAR | Status: AC

## 2015-03-19 MED ORDER — KETOROLAC TROMETHAMINE 60 MG/2ML IM SOLN: 60.0000 mg | Freq: Once | INTRAMUSCULAR | Status: DC

## 2015-03-19 MED ORDER — BENZONATATE 100 MG PO CAPS: 100.0000 mg | ORAL_CAPSULE | Freq: Three times a day (TID) | ORAL | Status: DC | PRN

## 2015-03-19 MED ORDER — IBUPROFEN 800 MG PO TABS: 800.0000 mg | ORAL_TABLET | Freq: Three times a day (TID) | ORAL | Status: DC | PRN

## 2015-03-19 MED ORDER — FEXOFENADINE-PSEUDOEPHED ER 60-120 MG PO TB12: 1.0000 | ORAL_TABLET | Freq: Two times a day (BID) | ORAL | Status: DC

## 2015-03-19 NOTE — Progress Notes (Signed)
   Subjective:    Patient ID: Ricky Avery, male    DOB: March 31, 1955, 60 y.o.   MRN: [id number]  HPI Patient c/o body ache, nasal congestion, watery eye, and cough for 2 days.  Denies N/V/D. Patient has taken Flu shot for this season.   Review of Systems    Negative except for compliant.  Objective:   Physical Exam Bilateral edematous nasal turbinates, bilateral maxillay guarding, and post nasal drainage.  Pharynx is erythematous w/o exudate. Neck supple w/o adenopathy.  Lungs CTA with Non-productive cough.  Heart RRR.       Assessment & Plan:Viral URI  Allergra-D, Ibuprofen, and Tessalon. Toradol given in clinic.  Follow up PRN.

## 2015-03-19 NOTE — Progress Notes (Signed)
Patient ID: Ricky Avery, male   DOB: 1955/10/21, 60 y.o.   MRN: [id number] Toradol 60mg  given to patient I.M right gluteal Lot # [id number]  Exp 03/18. Patient waited 15 mins No adverse reaction

## 2015-03-19 NOTE — Addendum Note (Signed)
Addended by: Vassie Loll D on: 03/19/2015 02:32 PM   Modules accepted: Orders

## 2015-04-19 ENCOUNTER — Encounter: Payer: Self-pay | Admitting: Radiation Oncology

## 2015-04-19 ENCOUNTER — Ambulatory Visit
Admission: RE | Admit: 2015-04-19 | Discharge: 2015-04-19 | Disposition: A | Discharge status: Home / Self Care | Payer: 59 | Source: Ambulatory Visit | Attending: Radiation Oncology | Admitting: Radiation Oncology

## 2015-04-19 ENCOUNTER — Inpatient Hospital Stay: Payer: 59 | Attending: Radiation Oncology

## 2015-04-19 ENCOUNTER — Other Ambulatory Visit: Payer: Self-pay | Admitting: *Deleted

## 2015-04-19 VITALS — BP 172/91 | HR 75 | Wt 154.1 lb

## 2015-04-19 DIAGNOSIS — C61 Malignant neoplasm of prostate: Secondary | ICD-10-CM | POA: Insufficient documentation

## 2015-04-19 LAB — PSA: PSA: 1.78 ng/mL (ref 0.00–4.00)

## 2015-04-19 NOTE — Progress Notes (Signed)
Radiation Oncology Follow up Note  Name: Ricky Avery   Date:   04/19/2015 MRN:  [id number] DOB: Sep 20, 1955    This 60 y.o. male presents to the clinic today for prostate cancer now out over a year for Gleason 7 (3+4) adenocarcinoma status post I MRT radiation therapy.  REFERRING PROVIDER: Tracie Harrier, MD  HPI: Patient is a 60 year old male now out over a year having completed IM RT radiation therapy for Gleason 7 (3+4) adenocarcinoma presenting the PSA of 3.7. He is seen today in routine follow-up and is doing well he specifically denies any lower urinary tract symptoms diarrhea.. His first PSA drawn 6 months ago was over 3. He is currently on Flomax tolerating that well without side effect. He does have erectile dysfunction and uses Cy dialysis for that.  COMPLICATIONS OF TREATMENT: none  FOLLOW UP COMPLIANCE: keeps appointments   PHYSICAL EXAM:  There were no vitals taken for this visit. On rectal exam rectal sphincter tone is good. Prostate is smooth contracted without evidence of nodularity or mass. Sulcus is preserved bilaterally. No discrete nodularity is identified. No other rectal abnormalities are noted. Well-developed well-nourished patient in NAD. HEENT reveals PERLA, EOMI, discs not visualized.  Oral cavity is clear. No oral mucosal lesions are identified. Neck is clear without evidence of cervical or supraclavicular adenopathy. Lungs are clear to A&P. Cardiac examination is essentially unremarkable with regular rate and rhythm without murmur rub or thrill. Abdomen is benign with no organomegaly or masses noted. Motor sensory and DTR levels are equal and symmetric in the upper and lower extremities. Cranial nerves II through XII are grossly intact. Proprioception is intact. No peripheral adenopathy or edema is identified. No motor or sensory levels are noted. Crude visual fields are within normal range.  RADIOLOGY RESULTS: No current films for review  PLAN: At the present time  he is doing well. I have run a PSA level on him today and will report that separately. Otherwise I'm please was overall progress. He continues on both Cy Allis and Flomax as described above. I have asked to see him back in 1 year for follow-up. Should his PSA be still be significantly elevated will follow-up sooner and make appropriate change in treatment plan.  I would like to take this opportunity for allowing me to participate in the care of your patient.Armstead Peaks., MD

## 2015-07-01 DIAGNOSIS — I10 Essential (primary) hypertension: Secondary | ICD-10-CM | POA: Diagnosis not present

## 2015-07-01 DIAGNOSIS — M1711 Unilateral primary osteoarthritis, right knee: Secondary | ICD-10-CM | POA: Diagnosis not present

## 2015-07-01 DIAGNOSIS — M5442 Lumbago with sciatica, left side: Secondary | ICD-10-CM | POA: Diagnosis not present

## 2015-07-01 DIAGNOSIS — M1612 Unilateral primary osteoarthritis, left hip: Secondary | ICD-10-CM | POA: Diagnosis not present

## 2015-07-08 DIAGNOSIS — M1711 Unilateral primary osteoarthritis, right knee: Secondary | ICD-10-CM | POA: Diagnosis not present

## 2015-07-08 DIAGNOSIS — L298 Other pruritus: Secondary | ICD-10-CM | POA: Diagnosis not present

## 2015-07-08 DIAGNOSIS — I10 Essential (primary) hypertension: Secondary | ICD-10-CM | POA: Diagnosis not present

## 2015-07-08 DIAGNOSIS — Z0000 Encounter for general adult medical examination without abnormal findings: Secondary | ICD-10-CM | POA: Diagnosis not present

## 2015-11-01 DIAGNOSIS — I10 Essential (primary) hypertension: Secondary | ICD-10-CM | POA: Diagnosis not present

## 2015-11-01 DIAGNOSIS — M1711 Unilateral primary osteoarthritis, right knee: Secondary | ICD-10-CM | POA: Diagnosis not present

## 2015-11-01 DIAGNOSIS — Z0000 Encounter for general adult medical examination without abnormal findings: Secondary | ICD-10-CM | POA: Diagnosis not present

## 2015-11-01 DIAGNOSIS — C61 Malignant neoplasm of prostate: Secondary | ICD-10-CM | POA: Diagnosis not present

## 2015-11-08 DIAGNOSIS — M1612 Unilateral primary osteoarthritis, left hip: Secondary | ICD-10-CM | POA: Diagnosis not present

## 2015-11-08 DIAGNOSIS — M5442 Lumbago with sciatica, left side: Secondary | ICD-10-CM | POA: Diagnosis not present

## 2015-11-08 DIAGNOSIS — I10 Essential (primary) hypertension: Secondary | ICD-10-CM | POA: Diagnosis not present

## 2015-11-08 DIAGNOSIS — C61 Malignant neoplasm of prostate: Secondary | ICD-10-CM | POA: Diagnosis not present

## 2016-02-07 ENCOUNTER — Ambulatory Visit: Payer: 59 | Admitting: Urology

## 2016-02-08 ENCOUNTER — Ambulatory Visit: Payer: 59 | Admitting: Urology

## 2016-02-13 NOTE — Progress Notes (Signed)
02/14/2016 10:51 AM   Ophelia Charter July 25, 1955 [id number]  Referring provider: Tracie Harrier, MD [address] [facility] [place], [place] [address]  Chief Complaint  Patient presents with  . Prostate Cancer    1year w/PSA  . Erectile Dysfunction    HPI: Patient is a 61 year old Hispanic male with prostate cancer who presents today with interpreter, Ronnald Collum,  for a 6 month follow up.   Patient was diagnosed with stage I Gleason 3+4 prostate cancer involving 1 out of 6 core biopsies in 10/2013 . His presenting PSA was 3.7. Prostate size 39.6 cc.  He underwent treatment with external beam radiation which was well tolerated. He is followed now by Dr. Noreene Filbert, radiation oncology, at the cancer center.  His most recent PSA was 1.78 on 03/2015.  His IPSS score today is 26, which is severe lower urinary tract symptomatology. He is mostly dissatisfied with his quality life due to his urinary symptoms.  His PVR is 0 mL.      His major complaints today are frequency, intermittency, straining to urinate and a weak urinary stream.  He has had these symptoms for since the radiation.  He has been experiencing post- void dripping for the last few months.  He denies any dysuria, hematuria or suprapubic pain.   He currently taking tamsulosin 0.4 mg daily.    He also denies any recent fevers, chills, nausea or vomiting.       IPSS    Row Name 02/14/16 1000         International Prostate Symptom Score   How often have you had the sensation of not emptying your bladder? Almost always     How often have you had to urinate less than every two hours? Almost always     How often have you found you stopped and started again several times when you urinated? About half the time     How often have you found it difficult to postpone urination? Less than half the time     How often have you had a weak urinary stream? More than half the time     How often have you had to strain to  start urination? Almost always     How many times did you typically get up at night to urinate? 2 Times     Total IPSS Score 26       Quality of Life due to urinary symptoms   If you were to spend the rest of your life with your urinary condition just the way it is now how would you feel about that? Mostly Disatisfied        Score:  1-7 Mild 8-19 Moderate 20-35 Severe   Erectile dysfunction His SHIM score is 17, which is mild ED.   He has been having difficulty with erections for several years   His major complaint is medication is not working as quickly as it has been in the past.  His libido is preserved.   His risk factors for ED are age, pelvic radiation, prostate cancer, HLD, CAD and blood pressure medications.   He denies any painful erections or curvatures with his erections.   He has tried Cialis in the past.        [place] Name 02/14/16 1006         SHIM: Over the last 6 months:   How do you rate your confidence that you could get and  keep an erection? Moderate     When you had erections with sexual stimulation, how often were your erections hard enough for penetration (entering your partner)? Almost Never or Never     During sexual intercourse, how often were you able to maintain your erection after you had penetrated (entered) your partner? Not Difficult     During sexual intercourse, how difficult was it to maintain your erection to completion of intercourse? Not Difficult     When you attempted sexual intercourse, how often was it satisfactory for you? Difficult       SHIM Total Score   SHIM 17        Score: 1-7 Severe ED 8-11 Moderate ED 12-16 Mild-Moderate ED 17-21 Mild ED 22-25 No ED    PMH: Past Medical History:  Diagnosis Date  . Hypercholesteremia   . Hypertension   . Prostate cancer [facility])     Surgical History: Past Surgical History:  Procedure Laterality Date  . CARDIAC CATHETERIZATION      Home Medications:  Allergies as of  02/14/2016      Reactions   No Known Allergies       Medication List       Accurate as of 02/14/16 10:51 AM. Always use your most recent med list.          aspirin 81 MG tablet Take 81 mg by mouth daily.   gabapentin 100 MG capsule Commonly known as:  NEURONTIN   lisinopril 10 MG tablet Commonly known as:  PRINIVIL,ZESTRIL   metoprolol succinate 25 MG 24 hr tablet Commonly known as:  TOPROL-XL Take 25 mg by mouth daily.   Potassium 99 MG Tabs Take by mouth.   simvastatin 40 MG tablet Commonly known as:  ZOCOR Take 40 mg by mouth daily.   tadalafil 20 MG tablet Commonly known as:  CIALIS Take 1 tablet (20 mg total) by mouth daily as needed for erectile dysfunction.   tamsulosin 0.4 MG Caps capsule Commonly known as:  FLOMAX Take 1 capsule (0.4 mg total) by mouth daily.   traMADol 50 MG tablet Commonly known as:  ULTRAM       Allergies:  Allergies  Allergen Reactions  . No Known Allergies     Family History: No family history on file.  Social History:  reports that he has quit smoking. He has never used smokeless tobacco. His alcohol and drug histories are not on file.  ROS: UROLOGY Frequent Urination?: Yes Hard to postpone urination?: No Burning/pain with urination?: No Get up at night to urinate?: No Leakage of urine?: Yes Urine stream starts and stops?: No Trouble starting stream?: No Do you have to strain to urinate?: No Blood in urine?: No Urinary tract infection?: No Sexually transmitted disease?: No Injury to kidneys or bladder?: No Painful intercourse?: No Weak stream?: Yes Erection problems?: Yes Penile pain?: No  Gastrointestinal Nausea?: No Vomiting?: No Indigestion/heartburn?: Yes Diarrhea?: No Constipation?: No  Constitutional Fever: No Night sweats?: No Weight loss?: No Fatigue?: No  Skin Skin rash/lesions?: No Itching?: No  Eyes Blurred vision?: No Double vision?: No  Ears/Nose/Throat Sore throat?: No Sinus  problems?: No  Hematologic/Lymphatic Swollen glands?: No Easy bruising?: No  Cardiovascular Leg swelling?: No Chest pain?: No  Respiratory Cough?: No Shortness of breath?: No  Endocrine Excessive thirst?: No  Musculoskeletal Back pain?: No Joint pain?: No  Neurological Headaches?: No Dizziness?: No  Psychologic Depression?: No Anxiety?: No  Physical Exam: BP 113/68   Pulse (!) 58  Ht 5\' 2"  (1.575 m)   Wt 150 lb (68 kg)   BMI 27.44 kg/m   Constitutional: Well nourished. Alert and oriented, No acute distress. HEENT: [place] AT, moist mucus membranes. Trachea midline, no masses. Cardiovascular: No clubbing, cyanosis, or edema. Respiratory: Normal respiratory effort, no increased work of breathing. GI: Abdomen is soft, non tender, non distended, no abdominal masses. Liver and spleen not palpable.  No hernias appreciated.  Stool sample for occult testing is not indicated.   GU: No CVA tenderness.  No bladder fullness or masses.  Patient with circumcised  phallus.   Urethral meatus is patent.  No penile discharge. No penile lesions or rashes. Scrotum without lesions, cysts, rashes and/or edema.  Testicles are located scrotally bilaterally. No masses are appreciated in the testicles. Left and right epididymis are normal. Rectal: Patient with  normal sphincter tone. Anus and perineum without scarring or rashes. No rectal masses are appreciated. Prostate is small and fibrous, no nodules are appreciated. Seminal vesicles are normal. Skin: No rashes, bruises or suspicious lesions. Lymph: No cervical or inguinal adenopathy. Neurologic: Grossly intact, no focal deficits, moving all 4 extremities. Psychiatric: Normal mood and affect.  Laboratory Data: Lab Results  Component Value Date   WBC 4.3 05/09/2011   HGB 14.9 05/09/2011   HCT 44.7 05/09/2011   MCV 93 05/09/2011   PLT 114 (L) 05/09/2011    Lab Results  Component Value Date   CREATININE 0.60 05/09/2011    Lab  Results  Component Value Date   PSA 1.78 04/19/2015   PSA 3.75 10/12/2014    Lab Results  Component Value Date   AST 31 05/09/2011   Lab Results  Component Value Date   ALT 37 05/09/2011     Pertinent Imaging: Results for JAYVIEN, HAMMERS (MRN [id number]) as of 02/14/2016 10:41  Ref. Range 02/14/2016 10:32  Scan Result Unknown 0    Assessment & Plan:    1. Prostate cancer  - patient has completed IM RT radiation therapy in 09/2014 for Gleason 7 (3+4) adenocarcinoma presenting the PSA of 3.7.  - current PSA 1.78  - RTC in 6 months for PSA and exam  2. LUTS  - IPSS score is 26/4  - Continue conservative management, avoiding bladder irritants and timed voiding's  - Continue tamsulosin 0.4 mg daily; refills given  - Will give a trial of Myrbetriq 25 mg daily, # 28 samples given have advised the patient of the side effects of Myrbetriq, such as: elevation in BP, urinary retention and/or HA.  - RTC in 3 weeks for IPS'S and PVR  3. Erectile dysfunction  - SHIM score is 17  - I explained to the patient that in order to achieve an erection it takes good functioning of the nervous system (parasympathetic, sympathetic, sensory and motor), good blood flow into the erectile tissue of the penis and a desire to have sex  - I explained that conditions like prostate cancer, pelvic radiation, diabetes, hypertension, coronary artery disease, peripheral vascular disease, smoking, alcohol consumption, age, sleep apnea and BPH can diminish the ability to have an erection  - Continue Cialis 20 mg on demand dosing; refills given  - RTC in 6 months for repeat SHIM score and exam   Return in about 3 weeks (around 03/06/2016) for IPSS and PVR .  These notes generated with voice recognition software. I apologize for typographical errors.  Zara Council, [facility] [address], [place], [place] [address] [phone]

## 2016-02-14 ENCOUNTER — Ambulatory Visit: Payer: 59 | Admitting: Urology

## 2016-02-14 ENCOUNTER — Encounter: Payer: Self-pay | Admitting: Urology

## 2016-02-14 VITALS — BP 113/68 | HR 58 | Ht 62.0 in | Wt 150.0 lb

## 2016-02-14 DIAGNOSIS — N529 Male erectile dysfunction, unspecified: Secondary | ICD-10-CM | POA: Diagnosis not present

## 2016-02-14 DIAGNOSIS — C61 Malignant neoplasm of prostate: Secondary | ICD-10-CM

## 2016-02-14 DIAGNOSIS — R399 Unspecified symptoms and signs involving the genitourinary system: Secondary | ICD-10-CM

## 2016-02-14 LAB — BLADDER SCAN AMB NON-IMAGING: SCAN RESULT: 0

## 2016-02-14 MED ORDER — TAMSULOSIN HCL 0.4 MG PO CAPS: 0.4000 mg | ORAL_CAPSULE | Freq: Every day | ORAL | 3 refills | Status: DC

## 2016-02-14 MED ORDER — TADALAFIL 20 MG PO TABS: 20.0000 mg | ORAL_TABLET | Freq: Every day | ORAL | 12 refills | Status: DC | PRN

## 2016-02-15 ENCOUNTER — Telehealth: Payer: Self-pay

## 2016-02-15 DIAGNOSIS — C61 Malignant neoplasm of prostate: Secondary | ICD-10-CM

## 2016-02-15 LAB — PSA: Prostate Specific Ag, Serum: 1.2 ng/mL (ref 0.0–4.0)

## 2016-02-15 NOTE — Telephone Encounter (Signed)
Spoke with pt via interpreter Louis [phone]. Made pt aware of PSA results and will need labs again in 17mo. Pt voiced understanding. Lab appt made.

## 2016-02-15 NOTE — Telephone Encounter (Signed)
-----   Message from Nori Riis, PA-C sent at 02/15/2016  7:56 AM EST ----- Patient's PSA is continuing a downward trend.  It will need to be repeated in 6 months.

## 2016-03-04 NOTE — Progress Notes (Signed)
03/06/2016 9:06 AM   Ricky Avery February 03, 1955 [id number]  Referring provider: Tracie Harrier, MD [address] [facility] [place], [place] [address]  Chief Complaint  Patient presents with  . Follow-up    3 week LUTS    HPI: Patient is a 61 year old Hispanic male with prostate cancer who presents today with interpreter, Ronnald Collum, for a 3 week follow up for a trial of Mybetriq 25 mg daily for urgency.    Background history Patient was diagnosed with stage I Gleason 3+4 prostate cancer involving 1 out of 6 core biopsies in 10/2013 . His presenting PSA was 3.7. Prostate size 39.6 cc.  He underwent treatment with external beam radiation which was well tolerated. He is followed now by Dr. Noreene Filbert, radiation oncology, at the cancer center.  His most recent PSA was 1.78 on 03/2015.  His IPSS score today is 26, which is severe lower urinary tract symptomatology. He is mostly dissatisfied with his quality life due to his urinary symptoms.  His PVR is 0 mL.   is major complaints today are frequency, intermittency, straining to urinate and a weak urinary stream.  He has had these symptoms for since the radiation.  He has been experiencing post- void dripping for the last few months.  He denies any dysuria, hematuria or suprapubic pain.  He currently taking tamsulosin 0.4 mg daily.  He also denies any recent fevers, chills, nausea or vomiting.   At his visit 3 weeks ago, he was given samples of Myrbetriq 25 mg.  He experienced extreme burning with urination.  He continued the Myrbetriq for the second week and developed urinary retention.  He then discontinued the samples.  He still complains of dysuria and incontinence.   He does state that the dysuria has abated and is only mild.  His I PSS score today is 10/3, which is improved.  His PVR is 11 mL.  He denies gross hematuria and suprapubic pain.  He denies fevers, chills, nausea and vomiting.       IPSS    Row Name 02/14/16 1000  03/06/16 0900       International Prostate Symptom Score   How often have you had the sensation of not emptying your bladder? Almost always Less than half the time    How often have you had to urinate less than every two hours? Almost always About half the time    How often have you found you stopped and started again several times when you urinated? About half the time Less than 1 in 5 times    How often have you found it difficult to postpone urination? Less than half the time Less than 1 in 5 times    How often have you had a weak urinary stream? More than half the time Less than 1 in 5 times    How often have you had to strain to start urination? Almost always Less than 1 in 5 times    How many times did you typically get up at night to urinate? 2 Times 1 Time    Total IPSS Score 26 10      Quality of Life due to urinary symptoms   If you were to spend the rest of your life with your urinary condition just the way it is now how would you feel about that? Mostly Disatisfied Mixed       Score:  1-7 Mild 8-19 Moderate 20-35 Severe   Erectile dysfunction  His SHIM score is 17, which is mild ED.   He has been having difficulty with erections for several years   His major complaint is medication is not working as quickly as it has been in the past.  His libido is preserved.   His risk factors for ED are age, pelvic radiation, prostate cancer, HLD, CAD and blood pressure medications.   He denies any painful erections or curvatures with his erections.   He has tried Cialis in the past.        [place] Name 02/14/16 1006         SHIM: Over the last 6 months:   How do you rate your confidence that you could get and keep an erection? Moderate     When you had erections with sexual stimulation, how often were your erections hard enough for penetration (entering your partner)? Almost Never or Never     During sexual intercourse, how often were you able to maintain your erection after  you had penetrated (entered) your partner? Not Difficult     During sexual intercourse, how difficult was it to maintain your erection to completion of intercourse? Not Difficult     When you attempted sexual intercourse, how often was it satisfactory for you? Difficult       SHIM Total Score   SHIM 17        Score: 1-7 Severe ED 8-11 Moderate ED 12-16 Mild-Moderate ED 17-21 Mild ED 22-25 No ED    PMH: Past Medical History:  Diagnosis Date  . Hypercholesteremia   . Hypertension   . Prostate cancer [facility])     Surgical History: Past Surgical History:  Procedure Laterality Date  . CARDIAC CATHETERIZATION      Home Medications:  Allergies as of 03/06/2016      Reactions   No Known Allergies       Medication List       Accurate as of 03/06/16  9:06 AM. Always use your most recent med list.          aspirin 81 MG tablet Take 81 mg by mouth daily.   gabapentin 100 MG capsule Commonly known as:  NEURONTIN   lisinopril 10 MG tablet Commonly known as:  PRINIVIL,ZESTRIL   metoprolol succinate 25 MG 24 hr tablet Commonly known as:  TOPROL-XL Take 25 mg by mouth daily.   Potassium 99 MG Tabs Take by mouth.   simvastatin 40 MG tablet Commonly known as:  ZOCOR Take 40 mg by mouth daily.   tadalafil 20 MG tablet Commonly known as:  CIALIS Take 1 tablet (20 mg total) by mouth daily as needed for erectile dysfunction.   tamsulosin 0.4 MG Caps capsule Commonly known as:  FLOMAX Take 1 capsule (0.4 mg total) by mouth daily.   traMADol 50 MG tablet Commonly known as:  ULTRAM       Allergies:  Allergies  Allergen Reactions  . No Known Allergies     Family History: Family History  Problem Relation Age of Onset  . Kidney cancer Neg Hx   . Prostate cancer Neg Hx   . Bladder Cancer Neg Hx     Social History:  reports that he has quit smoking. He has never used smokeless tobacco. He reports that he drinks alcohol. He reports that he does not use  drugs.  ROS: UROLOGY Frequent Urination?: No Hard to postpone urination?: No Burning/pain with urination?: Yes Get up at night to urinate?: No  Leakage of urine?: Yes Urine stream starts and stops?: No Trouble starting stream?: No Do you have to strain to urinate?: No Blood in urine?: No Urinary tract infection?: No Sexually transmitted disease?: No Injury to kidneys or bladder?: No Painful intercourse?: No Weak stream?: No Erection problems?: No Penile pain?: No  Gastrointestinal Nausea?: No Vomiting?: No Indigestion/heartburn?: No Diarrhea?: No Constipation?: No  Constitutional Fever: No Night sweats?: No Weight loss?: No Fatigue?: No  Skin Skin rash/lesions?: No Itching?: No  Eyes Blurred vision?: No Double vision?: No  Ears/Nose/Throat Sore throat?: No Sinus problems?: No  Hematologic/Lymphatic Swollen glands?: No Easy bruising?: No  Cardiovascular Leg swelling?: No Chest pain?: No  Respiratory Cough?: No Shortness of breath?: No  Endocrine Excessive thirst?: No  Musculoskeletal Back pain?: No Joint pain?: No  Neurological Headaches?: No Dizziness?: No  Psychologic Depression?: No Anxiety?: No  Physical Exam: BP (!) 153/87   Pulse (!) 57   Ht 5\' 1"  (1.549 m)   Wt 148 lb 8 oz (67.4 kg)   BMI 28.06 kg/m   Constitutional: Well nourished. Alert and oriented, No acute distress. HEENT: [place] AT, moist mucus membranes. Trachea midline, no masses. Cardiovascular: No clubbing, cyanosis, or edema. Respiratory: Normal respiratory effort, no increased work of breathing. GI: Abdomen is soft, non tender, non distended, no abdominal masses. Liver and spleen not palpable.  No hernias appreciated.  Stool sample for occult testing is not indicated.   Skin: No rashes, bruises or suspicious lesions. Lymph: No cervical or inguinal adenopathy. Neurologic: Grossly intact, no focal deficits, moving all 4 extremities. Psychiatric: Normal mood and  affect.  Laboratory Data: Lab Results  Component Value Date   WBC 4.3 05/09/2011   HGB 14.9 05/09/2011   HCT 44.7 05/09/2011   MCV 93 05/09/2011   PLT 114 (L) 05/09/2011    Lab Results  Component Value Date   CREATININE 0.60 05/09/2011    Lab Results  Component Value Date   PSA 1.78 04/19/2015   PSA 3.75 10/12/2014  PSA 1.2 ng/mL on 02/14/2016 Lab Results  Component Value Date   AST 31 05/09/2011   Lab Results  Component Value Date   ALT 37 05/09/2011     Pertinent Imaging: Results for Ricky Avery, Ricky Avery (MRN [id number]) as of 03/06/2016 09:07  Ref. Range 03/06/2016 09:05  Scan Result Unknown 11     Assessment & Plan:    1. Prostate cancer  - patient has completed IM RT radiation therapy in 09/2014 for Gleason 7 (3+4) adenocarcinoma presenting the PSA of 3.7.  - current PSA 1.78  - RTC in 6 months for PSA and exam  2. LUTS  - IPSS score is 10/3, it is improving  - Continue conservative management, avoiding bladder irritants and timed voiding's  - Continue tamsulosin 0.4 mg daily; refills given  - does not want to pursue further treatment for post void dribbling  - RTC in 6 months for I PSS and PVR  3. Erectile dysfunction  - SHIM score is 17  - I explained to the patient that in order to achieve an erection it takes good functioning of the nervous system (parasympathetic, sympathetic, sensory and motor), good blood flow into the erectile tissue of the penis and a desire to have sex  - I explained that conditions like prostate cancer, pelvic radiation, diabetes, hypertension, coronary artery disease, peripheral vascular disease, smoking, alcohol consumption, age, sleep apnea and BPH can diminish the ability to have an erection  - Continue Cialis 20 mg on  demand dosing; refills given - needs PA  - RTC in 6 months for repeat SHIM score and exam   No Follow-up on file.  These notes generated with voice recognition software. I apologize for typographical  errors.  Zara Council, [facility] [address] [place], [place] [address] [phone]

## 2016-03-06 ENCOUNTER — Ambulatory Visit: Payer: 59 | Admitting: Urology

## 2016-03-06 ENCOUNTER — Encounter: Payer: Self-pay | Admitting: Urology

## 2016-03-06 VITALS — BP 153/87 | HR 57 | Ht 61.0 in | Wt 148.5 lb

## 2016-03-06 DIAGNOSIS — G8929 Other chronic pain: Secondary | ICD-10-CM | POA: Diagnosis not present

## 2016-03-06 DIAGNOSIS — C61 Malignant neoplasm of prostate: Secondary | ICD-10-CM

## 2016-03-06 DIAGNOSIS — I10 Essential (primary) hypertension: Secondary | ICD-10-CM | POA: Diagnosis not present

## 2016-03-06 DIAGNOSIS — N401 Enlarged prostate with lower urinary tract symptoms: Secondary | ICD-10-CM | POA: Diagnosis not present

## 2016-03-06 DIAGNOSIS — E559 Vitamin D deficiency, unspecified: Secondary | ICD-10-CM | POA: Diagnosis not present

## 2016-03-06 DIAGNOSIS — N529 Male erectile dysfunction, unspecified: Secondary | ICD-10-CM | POA: Diagnosis not present

## 2016-03-06 DIAGNOSIS — M5442 Lumbago with sciatica, left side: Secondary | ICD-10-CM | POA: Diagnosis not present

## 2016-03-06 DIAGNOSIS — M1612 Unilateral primary osteoarthritis, left hip: Secondary | ICD-10-CM | POA: Diagnosis not present

## 2016-03-06 DIAGNOSIS — K219 Gastro-esophageal reflux disease without esophagitis: Secondary | ICD-10-CM | POA: Diagnosis not present

## 2016-03-06 DIAGNOSIS — M5441 Lumbago with sciatica, right side: Secondary | ICD-10-CM | POA: Diagnosis not present

## 2016-03-06 LAB — BLADDER SCAN AMB NON-IMAGING: SCAN RESULT: 11

## 2016-03-08 DIAGNOSIS — C61 Malignant neoplasm of prostate: Secondary | ICD-10-CM | POA: Diagnosis not present

## 2016-03-08 DIAGNOSIS — I10 Essential (primary) hypertension: Secondary | ICD-10-CM | POA: Diagnosis not present

## 2016-03-08 DIAGNOSIS — M5442 Lumbago with sciatica, left side: Secondary | ICD-10-CM | POA: Diagnosis not present

## 2016-03-08 DIAGNOSIS — M1612 Unilateral primary osteoarthritis, left hip: Secondary | ICD-10-CM | POA: Diagnosis not present

## 2016-03-16 ENCOUNTER — Telehealth: Payer: Self-pay

## 2016-03-16 NOTE — Telephone Encounter (Signed)
PA for cialis 20mg  DENIED!!! Pt needs to try viagra first.

## 2016-03-28 ENCOUNTER — Telehealth: Payer: Self-pay

## 2016-03-28 DIAGNOSIS — N529 Male erectile dysfunction, unspecified: Secondary | ICD-10-CM

## 2016-03-28 MED ORDER — SILDENAFIL CITRATE 100 MG PO TABS: 100.0000 mg | ORAL_TABLET | Freq: Every day | ORAL | 0 refills | Status: DC | PRN

## 2016-03-28 NOTE — Telephone Encounter (Signed)
Pt called via interpreter services through [facility], Bonnita Nasuti. Pt stated that he has tried viagra before and that's why he was needing the cialis. Made pt aware would need documentation of failure to viagra or will have to retry medication for insurance purposes. Pt elected to try viagra again. Per Larene Beach medication was sent to pharmacy.

## 2016-04-20 ENCOUNTER — Other Ambulatory Visit: Payer: Self-pay | Admitting: *Deleted

## 2016-04-20 ENCOUNTER — Ambulatory Visit
Admission: RE | Admit: 2016-04-20 | Discharge: 2016-04-20 | Disposition: A | Discharge status: Home / Self Care | Payer: 59 | Source: Ambulatory Visit | Attending: Radiation Oncology | Admitting: Radiation Oncology

## 2016-04-20 ENCOUNTER — Other Ambulatory Visit: Payer: Self-pay

## 2016-04-20 VITALS — BP 122/79 | HR 58 | Temp 97.4°F | Resp 18 | Wt 149.7 lb

## 2016-04-20 DIAGNOSIS — Z923 Personal history of irradiation: Secondary | ICD-10-CM | POA: Insufficient documentation

## 2016-04-20 DIAGNOSIS — C61 Malignant neoplasm of prostate: Secondary | ICD-10-CM

## 2016-04-20 DIAGNOSIS — N529 Male erectile dysfunction, unspecified: Secondary | ICD-10-CM | POA: Insufficient documentation

## 2016-04-20 LAB — PSA: PSA: 1.01 ng/mL (ref 0.00–4.00)

## 2016-04-20 NOTE — Progress Notes (Signed)
Radiation Oncology Follow up Note  Name: Ricky Avery   Date:   04/20/2016 MRN:  [id number] DOB: 1955/08/29    This 61 y.o. male presents to the clinic today for 2 year follow-up status post I MRT radiation therapy for Gleason 7 (3+4 adenocarcinoma the prostate.  REFERRING PROVIDER: Tracie Harrier, MD  HPI: Patient is a 61 year old male now out over 2 years having completed IM RT radiation therapy to his prostate for Gleason 7 (3+4) adenocarcinoma presenting the PSA 3.7. He is seen today in routine follow-up is doing well. We have tested his PSA today and will report that separately. His last 1 in January 2018 was 1.2. He has had some urinary symptoms including urgency frequency and nocturia 4. He is currently on Flomax although he takes it in intermittently sometimes 1 sometimes twice a day. He also does have erectile dysfunction is using Cialis with some mixed results..  COMPLICATIONS OF TREATMENT: none  FOLLOW UP COMPLIANCE: keeps appointments   PHYSICAL EXAM:  BP 122/79   Pulse (!) 58   Temp 97.4 F (36.3 C)   Resp 18   Wt 149 lb 11.1 oz (67.9 kg)   BMI 28.28 kg/m  On rectal exam rectal sphincter tone is good. Prostate is smooth contracted without evidence of nodularity or mass. Sulcus is preserved bilaterally. No discrete nodularity is identified. No other rectal abnormalities are noted. Well-developed well-nourished patient in NAD. HEENT reveals PERLA, EOMI, discs not visualized.  Oral cavity is clear. No oral mucosal lesions are identified. Neck is clear without evidence of cervical or supraclavicular adenopathy. Lungs are clear to A&P. Cardiac examination is essentially unremarkable with regular rate and rhythm without murmur rub or thrill. Abdomen is benign with no organomegaly or masses noted. Motor sensory and DTR levels are equal and symmetric in the upper and lower extremities. Cranial nerves II through XII are grossly intact. Proprioception is intact. No peripheral  adenopathy or edema is identified. No motor or sensory levels are noted. Crude visual fields are within normal range.  RADIOLOGY RESULTS: No current films for review  PLAN: I have asked him to start drinking as much cranberry juice locale as possible for some of his burning symptoms which she describes as 1 out of 10. He continues using Flomax. I will report his PSA separately. I've asked to see him back in 1 year for follow-up. Patient knows to call sooner with any concerns.  I would like to take this opportunity to thank you for allowing me to participate in the care of your patient.Armstead Peaks., MD

## 2016-05-04 ENCOUNTER — Telehealth: Payer: Self-pay | Admitting: Urology

## 2016-05-04 DIAGNOSIS — N529 Male erectile dysfunction, unspecified: Secondary | ICD-10-CM

## 2016-05-04 MED ORDER — TADALAFIL 20 MG PO TABS: 20.0000 mg | ORAL_TABLET | Freq: Every day | ORAL | 6 refills | Status: DC | PRN

## 2016-05-04 NOTE — Telephone Encounter (Signed)
Pt would like a 1 year refill on Sildenafil 100 mg to [facility].

## 2016-05-04 NOTE — Telephone Encounter (Signed)
A 73mo supply was given as Larene Beach dictated pt needs to be seen again in 7mo for f/u of ED.

## 2016-05-05 ENCOUNTER — Other Ambulatory Visit: Payer: Self-pay | Admitting: Urology

## 2016-05-05 DIAGNOSIS — N529 Male erectile dysfunction, unspecified: Secondary | ICD-10-CM

## 2016-07-06 ENCOUNTER — Other Ambulatory Visit: Payer: Self-pay | Admitting: Urology

## 2016-07-06 DIAGNOSIS — N529 Male erectile dysfunction, unspecified: Secondary | ICD-10-CM

## 2016-07-06 NOTE — Telephone Encounter (Signed)
Interpreter: Clifton James [phone]  Spoke to patient. Advised tamsulosin 0.4mg  should have refills left, viagra was sent electronically this a.m., pt should contact pharmacy. Pt verbalized understanding.

## 2016-07-06 NOTE — Telephone Encounter (Signed)
Pt needs a refill on the following prescriptions:  Tamsulosin HCL 0.4 mg Sildenafil 100 mg  To [facility] outpatient pharmacy.

## 2016-08-07 ENCOUNTER — Other Ambulatory Visit: Payer: Self-pay

## 2016-08-08 ENCOUNTER — Other Ambulatory Visit: Payer: 59

## 2016-08-08 DIAGNOSIS — N138 Other obstructive and reflux uropathy: Secondary | ICD-10-CM

## 2016-08-08 DIAGNOSIS — N401 Enlarged prostate with lower urinary tract symptoms: Secondary | ICD-10-CM | POA: Diagnosis not present

## 2016-08-09 LAB — PSA: PROSTATE SPECIFIC AG, SERUM: 1.2 ng/mL (ref 0.0–4.0)

## 2016-08-16 ENCOUNTER — Telehealth: Payer: Self-pay

## 2016-08-16 NOTE — Telephone Encounter (Signed)
-----   Message from Nori Riis, PA-C sent at 08/15/2016  8:58 PM EDT ----- Mr. Ricky Avery needs an appointment for a 6 moths follow up.

## 2016-08-16 NOTE — Telephone Encounter (Signed)
Called patient to schedule 6 month f/u per Shannon's previous note. No answer. Vmail not set up.

## 2016-08-18 NOTE — Telephone Encounter (Signed)
Pt came in to office and scheduled his 6 month follow up in October.

## 2016-08-29 DIAGNOSIS — Z125 Encounter for screening for malignant neoplasm of prostate: Secondary | ICD-10-CM | POA: Diagnosis not present

## 2016-08-29 DIAGNOSIS — M1612 Unilateral primary osteoarthritis, left hip: Secondary | ICD-10-CM | POA: Diagnosis not present

## 2016-08-29 DIAGNOSIS — C61 Malignant neoplasm of prostate: Secondary | ICD-10-CM | POA: Diagnosis not present

## 2016-08-29 DIAGNOSIS — I10 Essential (primary) hypertension: Secondary | ICD-10-CM | POA: Diagnosis not present

## 2016-08-29 DIAGNOSIS — M5442 Lumbago with sciatica, left side: Secondary | ICD-10-CM | POA: Diagnosis not present

## 2016-08-29 DIAGNOSIS — E7800 Pure hypercholesterolemia, unspecified: Secondary | ICD-10-CM | POA: Diagnosis not present

## 2016-08-29 DIAGNOSIS — E559 Vitamin D deficiency, unspecified: Secondary | ICD-10-CM | POA: Diagnosis not present

## 2016-09-05 DIAGNOSIS — K219 Gastro-esophageal reflux disease without esophagitis: Secondary | ICD-10-CM | POA: Diagnosis not present

## 2016-09-05 DIAGNOSIS — C61 Malignant neoplasm of prostate: Secondary | ICD-10-CM | POA: Diagnosis not present

## 2016-09-05 DIAGNOSIS — I10 Essential (primary) hypertension: Secondary | ICD-10-CM | POA: Diagnosis not present

## 2016-09-05 DIAGNOSIS — E559 Vitamin D deficiency, unspecified: Secondary | ICD-10-CM | POA: Diagnosis not present

## 2016-10-05 ENCOUNTER — Telehealth: Payer: Self-pay | Admitting: Urology

## 2016-10-05 ENCOUNTER — Other Ambulatory Visit: Payer: Self-pay | Admitting: Urology

## 2016-10-05 DIAGNOSIS — N529 Male erectile dysfunction, unspecified: Secondary | ICD-10-CM

## 2016-10-05 NOTE — Telephone Encounter (Signed)
done

## 2016-10-05 NOTE — Telephone Encounter (Signed)
Script sent  

## 2016-10-05 NOTE — Telephone Encounter (Signed)
Pt would like a refill of Sildenafil 100 mg sent to [facility].

## 2016-10-23 ENCOUNTER — Ambulatory Visit: Payer: 59 | Admitting: Urology

## 2016-10-24 NOTE — Progress Notes (Addendum)
10/26/2016 8:41 AM   Ricky Avery 07/01/1955 [id number]  Referring provider: Tracie Harrier, MD [address] [facility] [place], [place] [address]  Chief Complaint  Patient presents with  . Follow-up    6 months follow up Prostate cancer/Luts/ED    HPI: Patient is a 61 year old Hispanic male with prostate cancer, LUTS and ED who presents today for a 6 month follow-up with interpreter, Ronnald Collum.    Prostate cancer Patient was diagnosed with stage I Gleason 3+4 prostate cancer involving 1 out of 6 core biopsies in 10/2013 . His presenting PSA was 3.7.  Prostate size 39.6 cc.  He underwent treatment with external beam radiation which was well tolerated. He is followed now by Dr. Noreene Filbert, radiation oncology, at the cancer center.   His most recent PSA was 1.38 ng/mL on 08/29/2016.  His IPSS score today is 7, which is mild lower urinary tract symptomatology.  He is pleased with his quality life due to his urinary symptoms.   His previous I PSS score was 10/3.  His previous PVR was 11 mL.  His major complaints today are frequency, intermittency, straining to urinate and a weak urinary stream.  He has had these symptoms for since the radiation.  He has been experiencing post- void dripping for the last few months.  He denies any dysuria, hematuria or suprapubic pain.  He currently taking tamsulosin 0.4 mg daily.  He also denies any recent fevers, chills, nausea or vomiting.   He has had a trial with Myrbetriq, but it cause dysuria and he discontinued the medication.         IPSS    Row Name 10/26/16 0800         International Prostate Symptom Score   How often have you had the sensation of not emptying your bladder? Less than 1 in 5     How often have you had to urinate less than every two hours? Less than 1 in 5 times     How often have you found you stopped and started again several times when you urinated? Less than 1 in 5 times     How often have you found it  difficult to postpone urination? Less than 1 in 5 times     How often have you had a weak urinary stream? Less than 1 in 5 times     How often have you had to strain to start urination? Less than 1 in 5 times     How many times did you typically get up at night to urinate? 1 Time     Total IPSS Score 7       Quality of Life due to urinary symptoms   If you were to spend the rest of your life with your urinary condition just the way it is now how would you feel about that? Pleased        Score:  1-7 Mild 8-19 Moderate 20-35 Severe   Erectile dysfunction His SHIM score is 9, which is moderate  ED.   He has been having difficulty with erections for several years   His previous SHIM score was 17.  His major complaint is medication is not working as quickly as it has been in the past.  His libido is preserved.   His risk factors for ED are age, pelvic radiation, prostate cancer, HLD, CAD and blood pressure medications.   He denies any painful erections or curvatures with his erections.  He is having success with PDE 5 inhibitors.     SHIM    Row Name 10/26/16 [phone]         SHIM: Over the last 6 months:   How do you rate your confidence that you could get and keep an erection? Very Low     When you had erections with sexual stimulation, how often were your erections hard enough for penetration (entering your partner)? Almost Never or Never     During sexual intercourse, how often were you able to maintain your erection after you had penetrated (entered) your partner? Almost Never or Never     During sexual intercourse, how difficult was it to maintain your erection to completion of intercourse? Extremely Difficult     When you attempted sexual intercourse, how often was it satisfactory for you? Almost Always or Always       SHIM Total Score   SHIM 9        Score: 1-7 Severe ED 8-11 Moderate ED 12-16 Mild-Moderate ED 17-21 Mild ED 22-25 No ED    PMH: Past Medical History:    Diagnosis Date  . Hypercholesteremia   . Hypertension   . Prostate cancer [facility])     Surgical History: Past Surgical History:  Procedure Laterality Date  . CARDIAC CATHETERIZATION      Home Medications:  Allergies as of 10/26/2016      Reactions   No Known Allergies       Medication List       Accurate as of 10/26/16  8:41 AM. Always use your most recent med list.          aspirin 81 MG tablet Take 81 mg by mouth daily.   gabapentin 100 MG capsule Commonly known as:  NEURONTIN   hydrocortisone 2.5 % cream APPLY TOPICALLY TO AFFECTED AREA 2 TIMES DAILY X 10 DAYS   lisinopril 10 MG tablet Commonly known as:  PRINIVIL,ZESTRIL   meloxicam 15 MG tablet Commonly known as:  MOBIC Take by mouth.   metoprolol succinate 25 MG 24 hr tablet Commonly known as:  TOPROL-XL Take 25 mg by mouth daily.   Potassium 99 MG Tabs Take by mouth.   sildenafil 100 MG tablet Commonly known as:  VIAGRA TAKE 1 TABLET BY MOUTH DAILY AS NEEDED FOR ERECTILE DYSFUNCTION   sildenafil 100 MG tablet Commonly known as:  VIAGRA TAKE 1 TABLET BY MOUTH DAILY AS NEEDED FOR ERECTILE DYSFUNCTION   simvastatin 40 MG tablet Commonly known as:  ZOCOR Take 40 mg by mouth daily.   tadalafil 20 MG tablet Commonly known as:  CIALIS Take 1 tablet (20 mg total) by mouth daily as needed for erectile dysfunction.   tamsulosin 0.4 MG Caps capsule Commonly known as:  FLOMAX Take 1 capsule (0.4 mg total) by mouth daily.   traMADol 50 MG tablet Commonly known as:  ULTRAM       Allergies:  Allergies  Allergen Reactions  . No Known Allergies     Family History: Family History  Problem Relation Age of Onset  . Kidney cancer Neg Hx   . Prostate cancer Neg Hx   . Bladder Cancer Neg Hx     Social History:  reports that he has quit smoking. He has never used smokeless tobacco. He reports that he drinks alcohol. He reports that he does not use drugs.  ROS: UROLOGY Frequent Urination?:  No Hard to postpone urination?: No Burning/pain with urination?: No Get up at night to  urinate?: No Leakage of urine?: No Urine stream starts and stops?: No Trouble starting stream?: No Do you have to strain to urinate?: No Blood in urine?: No Urinary tract infection?: No Sexually transmitted disease?: No Injury to kidneys or bladder?: No Painful intercourse?: No Weak stream?: No Erection problems?: No Penile pain?: No  Gastrointestinal Nausea?: No Vomiting?: No Indigestion/heartburn?: No Diarrhea?: No Constipation?: No  Constitutional Fever: No Night sweats?: No Weight loss?: No Fatigue?: No  Skin Skin rash/lesions?: No Itching?: No  Eyes Blurred vision?: No Double vision?: No  Ears/Nose/Throat Sore throat?: No Sinus problems?: No  Hematologic/Lymphatic Swollen glands?: No Easy bruising?: No  Cardiovascular Leg swelling?: No Chest pain?: No  Respiratory Cough?: No Shortness of breath?: No  Endocrine Excessive thirst?: No  Musculoskeletal Back pain?: No Joint pain?: No  Neurological Headaches?: No Dizziness?: No  Psychologic Depression?: No Anxiety?: No  Physical Exam: BP 122/77   Pulse (!) 58   Ht 5\' 1"  (1.549 m)   Wt 142 lb 12.8 oz (64.8 kg)   BMI 26.98 kg/m   Constitutional: Well nourished. Alert and oriented, No acute distress. HEENT: [place] AT, moist mucus membranes. Trachea midline, no masses. Cardiovascular: No clubbing, cyanosis, or edema. Respiratory: Normal respiratory effort, no increased work of breathing. GI: Abdomen is soft, non tender, non distended, no abdominal masses. Liver and spleen not palpable.  No hernias appreciated.  Stool sample for occult testing is not indicated.   GU: No CVA tenderness.  No bladder fullness or masses.  Patient with uncircumcised phallus. Foreskin easily retracted   Vitlligo is present.  Urethral meatus is patent.  No penile discharge. No penile lesions or rashes. Scrotum without lesions,  cysts, rashes and/or edema.  Testicles are located scrotally bilaterally. No masses are appreciated in the testicles. Left and right epididymis are normal. Rectal: Patient with  normal sphincter tone. Anus and perineum without scarring or rashes. No rectal masses are appreciated. Prostate is approximately is small and fibrous, no nodules are appreciated. Seminal vesicles are normal. Skin: No rashes, bruises or suspicious lesions. Lymph: No cervical or inguinal adenopathy. Neurologic: Grossly intact, no focal deficits, moving all 4 extremities. Psychiatric: Normal mood and affect.  Laboratory Data: Lab Results  Component Value Date   PSA 1.01 04/20/2016   PSA 1.78 04/19/2015   PSA 3.75 10/12/2014  PSA 1.2 ng/mL on 02/14/2016 PSA 1.01 ng/mL on 04/20/2016 PSA 1.2 ng/mL on 08/08/2016 PSA 1.38 ng/mL on 08/29/2016  Assessment & Plan:    1. Prostate cancer  - patient has completed IM RT radiation therapy in 09/2014 for Gleason 7 (3+4) adenocarcinoma presenting the PSA of 3.7 - follow by Dr. Donella Stade at the cancer center as well  - current PSA 1.38 - patient with variations in his PSA - may be due to the fact that he has PSAs performed by different laboratories - we'll recheck at our facility in 3 months to monitor trend with our laboratory data  - RTC in 3 months for PSA  2. LUTS  - IPSS score is 7/1, it is improving  - Continue conservative management, avoiding bladder irritants and timed voiding's  - Continue tamsulosin 0.4 mg daily  - does not want to pursue further treatment for post void dribbling  - RTC in 6 months for I PSS and PVR  3. Erectile dysfunction  - SHIM score is 9, it is worsening  - success with PDE-5 inhibitors  - RTC in 6 months for repeat SHIM score and exam   Return  in about 3 months (around 01/26/2017) for PSA only.  These notes generated with voice recognition software. I apologize for typographical errors.  Zara Council, [facility] [address], [place],  [address] [phone]

## 2016-10-26 ENCOUNTER — Encounter: Payer: Self-pay | Admitting: Urology

## 2016-10-26 ENCOUNTER — Ambulatory Visit (INDEPENDENT_AMBULATORY_CARE_PROVIDER_SITE_OTHER): Payer: 59 | Admitting: Urology

## 2016-10-26 VITALS — BP 122/77 | HR 58 | Ht 61.0 in | Wt 142.8 lb

## 2016-10-26 DIAGNOSIS — R399 Unspecified symptoms and signs involving the genitourinary system: Secondary | ICD-10-CM

## 2016-10-26 DIAGNOSIS — N529 Male erectile dysfunction, unspecified: Secondary | ICD-10-CM

## 2016-10-26 DIAGNOSIS — C61 Malignant neoplasm of prostate: Secondary | ICD-10-CM

## 2017-01-04 ENCOUNTER — Telehealth: Payer: Self-pay | Admitting: Urology

## 2017-01-04 ENCOUNTER — Other Ambulatory Visit: Payer: Self-pay | Admitting: Urology

## 2017-01-04 DIAGNOSIS — N529 Male erectile dysfunction, unspecified: Secondary | ICD-10-CM

## 2017-01-04 NOTE — Telephone Encounter (Signed)
Pt would like a refill on Sildenafil Citrate 100 mg.  He uses the [facility].

## 2017-01-09 MED ORDER — SILDENAFIL CITRATE 100 MG PO TABS: ORAL_TABLET | ORAL | 3 refills | Status: DC

## 2017-01-09 NOTE — Telephone Encounter (Signed)
Refills given.

## 2017-01-24 ENCOUNTER — Other Ambulatory Visit: Payer: Self-pay

## 2017-01-24 DIAGNOSIS — C61 Malignant neoplasm of prostate: Secondary | ICD-10-CM

## 2017-01-26 ENCOUNTER — Other Ambulatory Visit: Payer: 59

## 2017-01-26 DIAGNOSIS — C61 Malignant neoplasm of prostate: Secondary | ICD-10-CM

## 2017-01-29 ENCOUNTER — Telehealth: Payer: Self-pay

## 2017-01-29 LAB — PSA: PROSTATE SPECIFIC AG, SERUM: 0.8 ng/mL (ref 0.0–4.0)

## 2017-01-29 NOTE — Telephone Encounter (Signed)
-----   Message from Nori Riis, PA-C sent at 01/29/2017 12:05 PM EST ----- PSA has decreased.  We will see him in 3 months for PSA, I PSS and exam.

## 2017-01-29 NOTE — Telephone Encounter (Signed)
Letter sent.

## 2017-02-05 ENCOUNTER — Ambulatory Visit (INDEPENDENT_AMBULATORY_CARE_PROVIDER_SITE_OTHER): Payer: Self-pay | Admitting: Emergency Medicine

## 2017-02-05 VITALS — BP 139/79 | HR 60 | Temp 97.6°F | Resp 16 | Wt 146.0 lb

## 2017-02-05 DIAGNOSIS — H1031 Unspecified acute conjunctivitis, right eye: Secondary | ICD-10-CM

## 2017-02-05 MED ORDER — TOBRAMYCIN 0.3 % OP SOLN: 1.0000 [drp] | OPHTHALMIC | 0 refills | Status: DC

## 2017-02-05 NOTE — Progress Notes (Signed)
Subjective:    Ricky Avery is a 62 y.o. male who presents for evaluation of discharge, pain, tearing and and reddness to the right eye in the right eye. He has noticed the above symptoms for 1 day. Onset was gradual. Patient denies blurred vision, foreign body sensation, photophobia and visual field deficit. There is a history of allergies and denies history of trauma, or working with dust, metal, or wood splinters..  The following portions of the patient's history were reviewed and updated as appropriate: allergies and current medications.  Review of Systems Pertinent items are noted in HPI.   Objective:    BP 139/79   Pulse 60   Temp 97.6 F (36.4 C)   Resp 16   Wt 146 lb (66.2 kg)   SpO2 96%   BMI 27.59 kg/m       General: alert, cooperative and appears stated age  Eyes:  negative findings: lids and lashes normal, pupils equal, round, reactive to light and accomodation and visual fields full to confrontation, positive findings: conjunctiva: injected  Vision: Not performed  Fluorescein:  not done     Assessment:    Acute conjunctivitis   Plan:    Discussed the diagnosis and proper care of conjunctivitis.  Stressed household Nurse, mental health. Ophthalmic drops per orders. Warm compress to eye(s). Local eye care discussed. Analgesics as needed.    Provided contact information for [facility], encourage follow up if symptoms do not improve, or er anytime symptoms worsen.

## 2017-02-05 NOTE — Patient Instructions (Signed)
Conjuntivitis bacteriana (Bacterial Conjunctivitis) La conjuntivitis bacteriana es una infeccin de la membrana transparente que cubre la parte blanca del ojo y la cara interna del prpado (conjuntiva). Cuando los vasos sanguneos de la conjuntiva se inflaman, el ojo se pone rojo o rosa, y es posible que le pique. La conjuntivitis bacteriana se transmite fcilmente de una persona a la otra (es contagiosa). Tambin se contagia fcilmente de un ojo al otro. CAUSAS La causa de esta afeccin son varias bacterias comunes. Puede contraer la infeccin si entra en contacto con otra persona que est infectada. Tambin puede entrar en contacto con elementos que estn contaminados con la bacteria, como una toalla para la cara, solucin para lentes de contacto o maquillaje para ojos. FACTORES DE RIESGO Es ms probable que esta afeccin se manifieste en las personas que:  Mantienen contacto fsico con personas que tienen la infeccin.  Usan lentes de contacto.  Tienen sinusitis.  Han tenido una lesin o ciruga reciente en el ojo.  Tiene debilitado el sistema de defensa del organismo (sistema inmunitario).  Tienen una afeccin mdica que causa sequedad en los ojos. SNTOMAS Los sntomas de esta afeccin incluyen lo siguiente:  Ojos rojos.  Lagrimeo u ojos llorosos.  Picazn en los ojos.  Sensacin de ardor en los ojos.  Secrecin espesa y amarillenta del ojo. Esta secrecin puede convertirse en una costra en el prpado durante la noche, que hace que los prpados se peguen.  Hinchazn de los prpados.  Visin borrosa. DIAGNSTICO El mdico puede diagnosticar esta afeccin en funcin de los sntomas y los antecedentes mdicos. El mdico tambin puede obtener una muestra de la secrecin del ojo para averiguar la causa de la infeccin. Esto no se hace con frecuencia. TRATAMIENTO El tratamiento de esta afeccin incluye lo siguiente:  Gotas o ungento para los ojos con antibitico para erradicar  la infeccin con ms rapidez y evitar el contagio a otras personas.  Antibiticos por va oral para tratar infecciones que no responden a las gotas o los ungentos, o que duran ms de 10das.  Paos hmedos fros (compresas fras) sobre los ojos.  Lgrimas artificiales aplicadas 2 a 6 veces por da. INSTRUCCIONES PARA EL CUIDADO EN EL HOGAR Medicamentos   Tome los antibiticos o aplqueselos como se lo haya indicado el mdico. No deje de tomar los antibiticos o de aplicrselos aunque comience a sentirse mejor.  Tome o aplquese los medicamentos de venta libre y recetados solamente como se lo haya indicado el mdico.  Tenga mucho cuidado de no tocar el borde del prpado con el frasco de las gotas para los ojos o el tubo del ungento cuando aplica los medicamentos en el ojo afectado. Esto evitar que se contagie la infeccin al otro ojo o a otras personas. Control de las molestias   Retire suavemente la secrecin de los ojos con un pao tibio y hmedo o con una torunda de algodn.  Aplquese un pao fro y limpio en el ojo durante 10 a 20 minutos, 3 a 4 veces al da. Instrucciones generales   No use lentes de contacto hasta que haya desaparecido la inflamacin y su mdico le indique que es seguro usarlos nuevamente. Pregntele al mdico cmo esterilizar o reemplazar sus lentes de contacto antes de usarlos nuevamente. Use anteojos hasta que pueda volver a usar los lentes de contacto.  Evite usar maquillaje en los ojos hasta que la inflamacin se haya ido. Descarte cosmticos viejos para los ojos que puedan estar contaminados.  Cambie o lave su   almohada todos los das.  No comparta las toallas o los paos. Esto puede propagar la infeccin.  Lave sus manos frecuentemente con agua y jabn. Use toallas de papel para secarse las manos.  Evite tocarse o frotarse los ojos.  No conduzca ni use maquinaria pesada si su visin es borrosa. SOLICITE ATENCIN MDICA SI:  Tiene fiebre.  Los  sntomas no mejoran despus de 10das de tratamiento. SOLICITE ATENCIN MDICA DE INMEDIATO SI:  Tiene fiebre y los sntomas empeoran repentinamente.  Siente dolor intenso cuando mueve el ojo.  Siente dolor u observa hinchazn o enrojecimiento en la cara.  Pierde la visin repentinamente. Esta informacin no tiene como fin reemplazar el consejo del mdico. Asegrese de hacerle al mdico cualquier pregunta que tenga. Document Released: 10/19/2004 Document Revised: 05/03/2015 Document Reviewed: 10/22/2014 Elsevier Interactive Patient Education  2017 Elsevier Inc.  

## 2017-03-06 DIAGNOSIS — I10 Essential (primary) hypertension: Secondary | ICD-10-CM | POA: Diagnosis not present

## 2017-03-06 DIAGNOSIS — R7989 Other specified abnormal findings of blood chemistry: Secondary | ICD-10-CM | POA: Diagnosis not present

## 2017-03-06 DIAGNOSIS — K219 Gastro-esophageal reflux disease without esophagitis: Secondary | ICD-10-CM | POA: Diagnosis not present

## 2017-03-06 DIAGNOSIS — C61 Malignant neoplasm of prostate: Secondary | ICD-10-CM | POA: Diagnosis not present

## 2017-03-07 ENCOUNTER — Telehealth: Payer: Self-pay | Admitting: Urology

## 2017-03-07 DIAGNOSIS — N529 Male erectile dysfunction, unspecified: Secondary | ICD-10-CM

## 2017-03-07 MED ORDER — TADALAFIL 20 MG PO TABS: 20.0000 mg | ORAL_TABLET | Freq: Every day | ORAL | 6 refills | Status: DC | PRN

## 2017-03-07 NOTE — Telephone Encounter (Signed)
Pt would like refill on Sildenafil 100 mg.  Sent to [facility] outpatient pharmacy

## 2017-03-07 NOTE — Telephone Encounter (Signed)
Refills sent to pharmacy. 

## 2017-03-13 DIAGNOSIS — R7989 Other specified abnormal findings of blood chemistry: Secondary | ICD-10-CM | POA: Diagnosis not present

## 2017-03-13 DIAGNOSIS — K219 Gastro-esophageal reflux disease without esophagitis: Secondary | ICD-10-CM | POA: Diagnosis not present

## 2017-03-13 DIAGNOSIS — C61 Malignant neoplasm of prostate: Secondary | ICD-10-CM | POA: Diagnosis not present

## 2017-03-13 DIAGNOSIS — I10 Essential (primary) hypertension: Secondary | ICD-10-CM | POA: Diagnosis not present

## 2017-04-11 ENCOUNTER — Other Ambulatory Visit: Payer: Self-pay | Admitting: Urology

## 2017-04-11 DIAGNOSIS — R399 Unspecified symptoms and signs involving the genitourinary system: Secondary | ICD-10-CM

## 2017-04-27 NOTE — Progress Notes (Signed)
04/30/2017 9:59 AM   Ricky Avery [birth year] [id number]  Referring provider: Tracie Harrier, MD [address] [facility] [place], [place] [address]  Chief Complaint  Patient presents with  . Elevated PSA    HPI: Patient is a 62 year old Hispanic male with prostate cancer, LUTS and ED who presents today for a 6 month follow-up with interpreter, Ronnald Collum.    Prostate cancer Patient was diagnosed with stage I Gleason 3+4 prostate cancer involving 1 out of 6 core biopsies in 10/2013 . His presenting PSA was 3.7.  Prostate size 39.6 cc.  He underwent treatment with external beam radiation which was well tolerated. He is followed now by Dr. Noreene Filbert, radiation oncology, at the cancer center.   His most recent PSA was 0.8 ng/mL on 01/26/2017.    His IPSS score today is 1, which is mild lower urinary tract symptomatology.  He is pleased with his quality life due to his urinary symptoms.   His previous I PSS score was 7/1.  His previous PVR was 11 mL.  His major complaints today are intermittent nocturia that occurs a few times a month.  He denies any dysuria, hematuria or suprapubic pain.  He currently taking tamsulosin 0.4 mg daily.  He also denies any recent fevers, chills, nausea or vomiting.   He has had a trial with Myrbetriq, but it cause dysuria and he discontinued the medication.     IPSS    Row Name 04/30/17 0900         International Prostate Symptom Score   How often have you had the sensation of not emptying your bladder?  Less than 1 in 5     How often have you had to urinate less than every two hours?  Not at All     How often have you found you stopped and started again several times when you urinated?  Not at All     How often have you found it difficult to postpone urination?  Not at All     How often have you had a weak urinary stream?  Not at All     How often have you had to strain to start urination?  Not at All     How many times did you  typically get up at night to urinate?  None     Total IPSS Score  1       Quality of Life due to urinary symptoms   If you were to spend the rest of your life with your urinary condition just the way it is now how would you feel about that?  Pleased        Score:  1-7 Mild 8-19 Moderate 20-35 Severe   Erectile dysfunction His SHIM score is 22, which is no ED.   He has been having difficulty with erections for several years   His previous SHIM score was 9.  His major complaint is medication is not working as quickly as it has been in the past.  His libido is preserved.   His risk factors for ED are age, pelvic radiation, prostate cancer, HLD, CAD and blood pressure medications.   He denies any painful erections or curvatures with his erections.   He is having success with PDE 5 inhibitors. SHIM    Row Name 04/30/17 0913         SHIM: Over the last 6 months:   How do you rate your confidence that you  could get and keep an erection?  High     When you had erections with sexual stimulation, how often were your erections hard enough for penetration (entering your partner)?  Most Times (much more than half the time)     During sexual intercourse, how often were you able to maintain your erection after you had penetrated (entered) your partner?  Most Times (much more than half the time)     During sexual intercourse, how difficult was it to maintain your erection to completion of intercourse?  Not Difficult     When you attempted sexual intercourse, how often was it satisfactory for you?  Almost Always or Always       SHIM Total Score   SHIM  22        Score: 1-7 Severe ED 8-11 Moderate ED 12-16 Mild-Moderate ED 17-21 Mild ED 22-25 No ED  He has had an incident of left sided waist pain that felt like a "bubble" going through his side.  Nothing makes it worse.  Nothing makes it better.  The pain is not intense.    PMH: Past Medical History:  Diagnosis Date  . Hypercholesteremia     . Hypertension   . Prostate cancer [facility])     Surgical History: Past Surgical History:  Procedure Laterality Date  . CARDIAC CATHETERIZATION      Home Medications:  Allergies as of 04/30/2017      Reactions   No Known Allergies       Medication List        Accurate as of 04/30/17  9:59 AM. Always use your most recent med list.          aspirin 81 MG tablet Take 81 mg by mouth daily.   gabapentin 100 MG capsule Commonly known as:  NEURONTIN   hydrocortisone 2.5 % cream APPLY TOPICALLY TO AFFECTED AREA 2 TIMES DAILY X 10 DAYS   lisinopril 10 MG tablet Commonly known as:  PRINIVIL,ZESTRIL   meloxicam 15 MG tablet Commonly known as:  MOBIC Take by mouth.   metoprolol succinate 25 MG 24 hr tablet Commonly known as:  TOPROL-XL Take 25 mg by mouth daily.   Potassium 99 MG Tabs Take by mouth.   sildenafil 100 MG tablet Commonly known as:  VIAGRA TAKE 1 TABLET BY MOUTH DAILY AS NEEDED FOR ERECTILE DYSFUNCTION   sildenafil 100 MG tablet Commonly known as:  VIAGRA TAKE 1 TABLET BY MOUTH DAILY AS NEEDED FOR ERECTILE DYSFUNCTION   simvastatin 40 MG tablet Commonly known as:  ZOCOR Take 40 mg by mouth daily.   tadalafil 20 MG tablet Commonly known as:  CIALIS Take 1 tablet (20 mg total) by mouth daily as needed for erectile dysfunction.   tamsulosin 0.4 MG Caps capsule Commonly known as:  FLOMAX TAKE 1 CAPSULE BY MOUTH DAILY.   tobramycin 0.3 % ophthalmic solution Commonly known as:  TOBREX Place 1 drop into the right eye every 4 (four) hours.   traMADol 50 MG tablet Commonly known as:  ULTRAM       Allergies:  Allergies  Allergen Reactions  . No Known Allergies     Family History: Family History  Problem Relation Age of Onset  . Kidney cancer Neg Hx   . Prostate cancer Neg Hx   . Bladder Cancer Neg Hx     Social History:  reports that he has quit smoking. He has never used smokeless tobacco. He reports that he drinks alcohol. He reports that  he does not use drugs.  ROS: UROLOGY Frequent Urination?: No Hard to postpone urination?: No Burning/pain with urination?: No Get up at night to urinate?: Yes Leakage of urine?: No Urine stream starts and stops?: No Trouble starting stream?: No Do you have to strain to urinate?: No Blood in urine?: No Urinary tract infection?: No Sexually transmitted disease?: No Injury to kidneys or bladder?: No Painful intercourse?: No Weak stream?: No Erection problems?: No Penile pain?: No  Gastrointestinal Nausea?: No Vomiting?: No Indigestion/heartburn?: No Diarrhea?: No Constipation?: No  Constitutional Fever: No Night sweats?: No Weight loss?: No Fatigue?: No  Skin Skin rash/lesions?: No Itching?: No  Eyes Blurred vision?: No Double vision?: No  Ears/Nose/Throat Sore throat?: No Sinus problems?: No  Hematologic/Lymphatic Swollen glands?: No Easy bruising?: No  Cardiovascular Leg swelling?: No Chest pain?: No  Respiratory Cough?: No Shortness of breath?: No  Endocrine Excessive thirst?: No  Musculoskeletal Back pain?: Yes Joint pain?: No  Neurological Headaches?: No Dizziness?: No  Psychologic Depression?: No Anxiety?: No  Physical Exam: BP (!) 96/91   Pulse 61   Ht 5\' 1"  (1.549 m)   Wt 149 lb (67.6 kg)   BMI 28.15 kg/m   Constitutional: Well nourished. Alert and oriented, No acute distress. HEENT: [place] AT, moist mucus membranes. Trachea midline, no masses. Cardiovascular: No clubbing, cyanosis, or edema. Respiratory: Normal respiratory effort, no increased work of breathing. GI: Abdomen is soft, non tender, non distended, no abdominal masses. Liver and spleen not palpable.  No hernias appreciated.  Stool sample for occult testing is not indicated.   GU: No CVA tenderness.  No bladder fullness or masses.  Patient with uncircumcised phallus.  Foreskin easily retracted  Urethral meatus is patent.  No penile discharge. No penile lesions or  rashes. Scrotum without lesions, cysts, rashes and/or edema.  Testicles are located scrotally bilaterally. No masses are appreciated in the testicles. Left and right epididymis are normal. Rectal: Patient with  normal sphincter tone. Anus and perineum without scarring or rashes. No rectal masses are appreciated. Prostate is small and fibrous, no nodules are appreciated. Seminal vesicles are normal. Skin: No rashes, bruises or suspicious lesions. Lymph: No cervical or inguinal adenopathy. Neurologic: Grossly intact, no focal deficits, moving all 4 extremities. Psychiatric: Normal mood and affect.   Laboratory Data: Lab Results  Component Value Date   PSA 1.01 04/20/2016   PSA 1.78 04/19/2015   PSA 3.75 10/12/2014  PSA 1.2 ng/mL on 02/14/2016 PSA 1.01 ng/mL on 04/20/2016 PSA 1.2 ng/mL on 08/08/2016 PSA 1.38 ng/mL on 08/29/2016 PSA 0.8 ng/mL on 01/26/2017  Assessment & Plan:    1. Prostate cancer  - patient has completed IM RT radiation therapy in 09/2014 for Gleason 7 (3+4) adenocarcinoma presenting the PSA of 3.7 - follow by Dr. Donella Stade at the cancer center as well  - current PSA 0.8   - RTC in 6 months for PSA - if today's PSA is stable  2. LUTS  - IPSS score is 1/1, it is improving  - Continue conservative management, avoiding bladder irritants and timed voiding's  - Continue tamsulosin 0.4 mg daily  - RTC in 6 months for I PSS and PVR  3. Erectile dysfunction  - SHIM score is 22, it is improved  - success with PDE-5 inhibitors  - RTC in 6 months for repeat SHIM score and exam   4. Flank pain Obtain a KUB today   Return in about 6 months (around 10/30/2017) for IPSS, SHIM, PSA and exam.  These notes generated with voice recognition software. I apologize for typographical errors.  Zara Council, [facility] [address], [place], [place] [address] [phone]

## 2017-04-30 ENCOUNTER — Encounter: Payer: Self-pay | Admitting: Urology

## 2017-04-30 ENCOUNTER — Ambulatory Visit
Admission: RE | Admit: 2017-04-30 | Discharge: 2017-04-30 | Disposition: A | Discharge status: Home / Self Care | Payer: 59 | Source: Ambulatory Visit | Attending: Urology | Admitting: Urology

## 2017-04-30 ENCOUNTER — Ambulatory Visit (INDEPENDENT_AMBULATORY_CARE_PROVIDER_SITE_OTHER): Payer: 59 | Admitting: Urology

## 2017-04-30 VITALS — BP 96/91 | HR 61 | Ht 61.0 in | Wt 149.0 lb

## 2017-04-30 DIAGNOSIS — R109 Unspecified abdominal pain: Secondary | ICD-10-CM | POA: Insufficient documentation

## 2017-04-30 DIAGNOSIS — C61 Malignant neoplasm of prostate: Secondary | ICD-10-CM | POA: Diagnosis not present

## 2017-04-30 DIAGNOSIS — R399 Unspecified symptoms and signs involving the genitourinary system: Secondary | ICD-10-CM

## 2017-04-30 DIAGNOSIS — N529 Male erectile dysfunction, unspecified: Secondary | ICD-10-CM

## 2017-05-01 ENCOUNTER — Telehealth: Payer: Self-pay | Admitting: Family Medicine

## 2017-05-01 LAB — PSA: PROSTATE SPECIFIC AG, SERUM: 0.9 ng/mL (ref 0.0–4.0)

## 2017-05-01 NOTE — Telephone Encounter (Signed)
-----   Message from Nori Riis, PA-C sent at 05/01/2017  7:22 AM EDT ----- Please send him a letter regarding his PSA.  He will need an interpreter if you speak with him.

## 2017-05-01 NOTE — Telephone Encounter (Signed)
VM not setup unable to leave message.

## 2017-05-03 ENCOUNTER — Other Ambulatory Visit: Payer: Self-pay

## 2017-05-03 ENCOUNTER — Ambulatory Visit: Payer: Self-pay | Admitting: Radiation Oncology

## 2017-05-03 ENCOUNTER — Other Ambulatory Visit: Payer: Self-pay | Admitting: *Deleted

## 2017-05-03 ENCOUNTER — Encounter: Payer: Self-pay | Admitting: Radiation Oncology

## 2017-05-03 ENCOUNTER — Ambulatory Visit
Admission: RE | Admit: 2017-05-03 | Discharge: 2017-05-03 | Disposition: A | Discharge status: Home / Self Care | Payer: 59 | Source: Ambulatory Visit | Attending: Radiation Oncology | Admitting: Radiation Oncology

## 2017-05-03 VITALS — BP 139/81 | HR 61 | Resp 18 | Wt 149.0 lb

## 2017-05-03 DIAGNOSIS — C61 Malignant neoplasm of prostate: Secondary | ICD-10-CM | POA: Diagnosis not present

## 2017-05-03 DIAGNOSIS — Z923 Personal history of irradiation: Secondary | ICD-10-CM | POA: Diagnosis not present

## 2017-05-03 NOTE — Progress Notes (Signed)
Radiation Oncology Follow up Note  Name: Ricky Avery   Date:   05/03/2017 MRN:  [id number] DOB: [birth year]    This 62 y.o. male presents to the clinic today for 3 year follow-up status post I MRT radiation therapy for Gleason 7 (3+4) adenocarcinoma the prostate.  REFERRING PROVIDER: Tracie Harrier, MD  HPI: patient is a 62 year old male now out 3 years having completed IM RT radiation therapy to his prostate for a Gleason 7 (3+4) adenocarcinoma. Seen today in routine follow-up from you a urologic standpoint he is doing well specifically denies any increased lower urinary tract symptoms diarrhea. States he ate some cherries a few weeks ago had some burning in his mouth not sure of the etiology of that. His most recent PSA was 0.9 this month..  COMPLICATIONS OF TREATMENT: none  FOLLOW UP COMPLIANCE: keeps appointments   PHYSICAL EXAM:  BP 139/81   Pulse 61   Resp 18   Wt 149 lb 0.5 oz (67.6 kg)   BMI 28.16 kg/m  Well-developed well-nourished patient in NAD. HEENT reveals PERLA, EOMI, discs not visualized.  Oral cavity is clear. No oral mucosal lesions are identified. Neck is clear without evidence of cervical or supraclavicular adenopathy. Lungs are clear to A&P. Cardiac examination is essentially unremarkable with regular rate and rhythm without murmur rub or thrill. Abdomen is benign with no organomegaly or masses noted. Motor sensory and DTR levels are equal and symmetric in the upper and lower extremities. Cranial nerves II through XII are grossly intact. Proprioception is intact. No peripheral adenopathy or edema is identified. No motor or sensory levels are noted. Crude visual fields are within normal range.  RADIOLOGY RESULTS: no current films for review  PLAN: present time patient is doing well under excellent biochemical control of his prostate cancer. I've asked to see him back in 1 year for follow-up with a PSA prior to that visit. Patient knows to call sooner with any  concerns.  I would like to take this opportunity to thank you for allowing me to participate in the care of your patient.Noreene Filbert, MD

## 2017-05-03 NOTE — Telephone Encounter (Signed)
Letter sent.

## 2017-05-15 ENCOUNTER — Other Ambulatory Visit: Payer: Self-pay | Admitting: Internal Medicine

## 2017-05-15 DIAGNOSIS — C61 Malignant neoplasm of prostate: Secondary | ICD-10-CM

## 2017-05-15 DIAGNOSIS — R1032 Left lower quadrant pain: Secondary | ICD-10-CM

## 2017-05-15 DIAGNOSIS — I10 Essential (primary) hypertension: Secondary | ICD-10-CM | POA: Diagnosis not present

## 2017-05-31 ENCOUNTER — Ambulatory Visit
Admission: RE | Admit: 2017-05-31 | Discharge: 2017-05-31 | Disposition: A | Discharge status: Home / Self Care | Payer: 59 | Source: Ambulatory Visit | Attending: Internal Medicine | Admitting: Internal Medicine

## 2017-05-31 DIAGNOSIS — C61 Malignant neoplasm of prostate: Secondary | ICD-10-CM | POA: Insufficient documentation

## 2017-05-31 DIAGNOSIS — R1032 Left lower quadrant pain: Secondary | ICD-10-CM | POA: Insufficient documentation

## 2017-05-31 DIAGNOSIS — I10 Essential (primary) hypertension: Secondary | ICD-10-CM | POA: Diagnosis not present

## 2017-05-31 LAB — POCT I-STAT CREATININE: Creatinine, Ser: 0.7 mg/dL (ref 0.61–1.24)

## 2017-05-31 MED ORDER — IOPAMIDOL (ISOVUE-370) INJECTION 76%
75.0000 mL | Freq: Once | INTRAVENOUS | Status: AC | PRN
  Administered 2017-05-31: 75 mL via INTRAVENOUS

## 2017-08-06 ENCOUNTER — Encounter: Payer: Self-pay | Admitting: Family Medicine

## 2017-08-06 ENCOUNTER — Ambulatory Visit (INDEPENDENT_AMBULATORY_CARE_PROVIDER_SITE_OTHER): Payer: Self-pay | Admitting: Family Medicine

## 2017-08-06 VITALS — HR 60 | Temp 97.8°F | Wt 150.0 lb

## 2017-08-06 DIAGNOSIS — R197 Diarrhea, unspecified: Secondary | ICD-10-CM

## 2017-08-06 DIAGNOSIS — R52 Pain, unspecified: Secondary | ICD-10-CM | POA: Diagnosis not present

## 2017-08-06 NOTE — Progress Notes (Signed)
Patient ID: Ricky Avery, male    DOB: 10-29-1955, 62 y.o.   MRN: [id number]  PCP: Tracie Harrier, MD    Subjective:  HPI Interpreter used for translation   Ricky Avery is a 62 y.o. male presents today complaining of two days of generalized body aches and frequent diarrhea. Symptoms initially start 2 nights ago and have not improved much with Pepto-Bismol and Aleve. 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 To his knowledge he has remained afebrile. Complains that body aches have not improved. Some chills. Stool are watery and occurring every 15 minutes multiple times per day. Denies nausea. Unknown of sick contact. Denies eating any different foods. He has a history of prostate cancer and is currently followed by [facility] and not currently receiving treatment. He had some issues with abdominal pain of the left lower abdomen months prior and underwent a CT of abdomen which was negative for any acute findings. He is followed by [facility]. Social History   Socioeconomic History  . Marital status: Married    Spouse name: Not on file  . Number of children: Not on file  . Years of education: Not on file  . Highest education level: Not on file  Occupational History  . Not on file  Social Needs  . Financial resource strain: Not on file  . Food insecurity:    Worry: Not on file    Inability: Not on file  . Transportation needs:    Medical: Not on file    Non-medical: Not on file  Tobacco Use  . Smoking status: Former Research scientist (life sciences)  . Smokeless tobacco: Never Used  . Tobacco comment: quit 2007  Substance and Sexual Activity  . Alcohol use: Yes    Alcohol/week: 0.0 oz    Comment: socially  . Drug use: No  . Sexual activity: Not on file  Lifestyle  . Physical activity:    Days per week: Not on file    Minutes per session: Not on file  . Stress: Not on file  Relationships  . Social connections:    Talks on phone: Not on  file    Gets together: Not on file    Attends religious service: Not on file    Active member of club or organization: Not on file    Attends meetings of clubs or organizations: Not on file    Relationship status: Not on file  . Intimate partner violence:    Fear of current or ex partner: Not on file    Emotionally abused: Not on file    Physically abused: Not on file    Forced sexual activity: Not on file  Other Topics Concern  . Not on file  Social History Narrative  . Not on file    Family History  Problem Relation Age of Onset  . Kidney cancer Neg Hx   . Prostate cancer Neg Hx   . Bladder Cancer Neg Hx     Review of Systems Pertinent negative listed in HPI   Patient Active Problem List   Diagnosis Date Noted  . Arthritis 02/05/2015  . Acid reflux 02/05/2015  . BP (high blood pressure) 02/05/2015  . Blood glucose elevated 03/30/2014  . Pure hypercholesterolemia 03/30/2014  . Adenocarcinoma of prostate ([place]) 11/26/2013  . Vitamin D deficiency 11/26/2013  . Malignant neoplasm of prostate ([place]) 11/20/2013  . Arthritis of knee, degenerative 10/08/2013  . 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  Urge incontinence of urine 05/16/2012  . Benign prostatic hyperplasia with urinary obstruction 05/15/2012  . Chronic prostatitis 05/15/2012  . ED (erectile dysfunction) of organic origin 05/15/2012    Allergies  Allergen Reactions  . No Known Allergies     Prior to Admission medications   Medication Sig Start Date End Date Taking? Authorizing Provider  aspirin 81 MG tablet Take 81 mg by mouth daily.   Yes [provider]  lisinopril (PRINIVIL,ZESTRIL) 10 MG tablet  12/31/14  Yes [provider]  metoprolol succinate (TOPROL-XL) 25 MG 24 hr tablet Take 25 mg by mouth daily.   Yes [provider]  Potassium 99 MG TABS Take by mouth.   Yes [provider]  sildenafil (VIAGRA) 100 MG tablet TAKE 1 TABLET BY MOUTH DAILY AS NEEDED FOR ERECTILE DYSFUNCTION  07/06/16  Yes Hollice Espy, MD  simvastatin (ZOCOR) 40 MG tablet Take 40 mg by mouth daily.   Yes [provider]  tamsulosin (FLOMAX) 0.4 MG CAPS capsule TAKE 1 CAPSULE BY MOUTH DAILY. 04/11/17  Yes McGowan, Larene Beach A, PA-C  sildenafil (VIAGRA) 100 MG tablet TAKE 1 TABLET BY MOUTH DAILY AS NEEDED FOR ERECTILE DYSFUNCTION Patient not taking: Reported on 08/06/2017 01/09/17   Nori Riis, PA-C    Past Medical, Surgical Family and Social History reviewed and updated.    Objective:   Today's Vitals   08/06/17 0950  Pulse: 60  Temp: 97.8 F (36.6 C)  SpO2: 99%  Weight: 150 lb (68 kg)    Wt Readings from Last 3 Encounters:  08/06/17 150 lb (68 kg)  05/03/17 149 lb 0.5 oz (67.6 kg)  04/30/17 149 lb (67.6 kg)   Physical Exam Deferred as patient is being referred to PCP walk-in clinic for GI panel and any other pertinent labs or diagnostic work-up.   Assessment & Plan:  1. Diarrhea, unspecified type 2. 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 Patient was given very strict instructions not to return to work until diarrhea has completely resolved and or GI pathogen is ruled out as underlying cause symptoms.  If symptoms worsen or do not improve, return for follow-up, follow-up with PCP, or at the emergency department if severity of symptoms warrant a higher level of care.     Carroll Sage. Kenton Kingfisher, MSN, FNP-C [facility]  [place], [place] [address] [phone]

## 2017-08-06 NOTE — Patient Instructions (Signed)
I recommend that you follow-up immediately at [facility] for further evaluation and work-up of symptoms.  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 Please go immediately for further evaluation at [facility].  Le recomiendo que realice un seguimiento inmediato en [facility] para una evaluacin y un trabajo adicional de los sntomas. Usted puede tener una infeccin gastrointestinal subyacente que es preocupante y plantea un riesgo para otros mientras trabaja dentro del departamento diettico. Por favor, vaya inmediatamente para una evaluacin adicional en [employer].    Diarrhea, Adult Diarrhea is when you have loose and water poop (stool) often. Diarrhea can make you feel weak and cause you to get dehydrated. Dehydration can make you tired and thirsty, make you have a dry mouth, and make it so you pee (urinate) less often. Diarrhea often lasts 2-3 days. However, it can last longer if it is a sign of something more serious. It is important to treat your diarrhea as told by your doctor. Follow these instructions at home: Eating and drinking  Follow these recommendations as told by your doctor:  Take an oral rehydration solution (ORS). This is a drink that is sold at pharmacies and stores.  Drink clear fluids, such as: ? Water. ? Ice chips. ? Diluted fruit juice. ? Low-calorie sports drinks.  Eat bland, easy-to-digest foods in small amounts as you are able. These foods include: ? Bananas. ? Applesauce. ? Rice. ? Low-fat (lean) meats. ? Toast. ? Crackers.  Avoid drinking fluids that have a lot of sugar or caffeine in them.  Avoid alcohol.  Avoid spicy or fatty foods.  General instructions   Drink enough fluid to keep your pee (urine) clear or pale yellow.  Wash your hands often. If you cannot use soap and water, use hand  sanitizer.  Make sure that all people in your home wash their hands well and often.  Take over-the-counter and prescription medicines only as told by your doctor.  Rest at home while you get better.  Watch your condition for any changes.  Take a warm bath to help with any burning or pain from having diarrhea.  Keep all follow-up visits as told by your doctor. This is important. Contact a doctor if:  You have a fever.  Your diarrhea gets worse.  You have new symptoms.  You cannot keep fluids down.  You feel light-headed or dizzy.  You have a headache.  You have muscle cramps. Get help right away if:  You have chest pain.  You feel very weak or you pass out (faint).  You have bloody or black poop or poop that look like tar.  You have very bad pain, cramping, or bloating in your belly (abdomen).  You have trouble breathing or you are breathing very quickly.  Your heart is beating very quickly.  Your skin feels cold and clammy.  You feel confused.  You have signs of dehydration, such as: ? Dark pee, hardly any pee, or no pee. ? Cracked lips. ? Dry mouth. ? Sunken eyes. ? Sleepiness. ? Weakness. This information is not intended to replace advice given to you by your health care provider. Make sure you discuss any questions you have with your health care provider. Document Released: 06/28/2007 Document Revised: 07/30/2015 Document Reviewed: 09/15/2014 Elsevier Interactive Patient Education  2018 [employer].

## 2017-08-10 ENCOUNTER — Telehealth: Payer: Self-pay | Admitting: Emergency Medicine

## 2017-08-10 NOTE — Telephone Encounter (Signed)
Attempted to contact patient voice mail box not set up

## 2017-08-31 DIAGNOSIS — I10 Essential (primary) hypertension: Secondary | ICD-10-CM | POA: Diagnosis not present

## 2017-08-31 DIAGNOSIS — R7989 Other specified abnormal findings of blood chemistry: Secondary | ICD-10-CM | POA: Diagnosis not present

## 2017-08-31 DIAGNOSIS — Z0000 Encounter for general adult medical examination without abnormal findings: Secondary | ICD-10-CM | POA: Diagnosis not present

## 2017-08-31 DIAGNOSIS — C61 Malignant neoplasm of prostate: Secondary | ICD-10-CM | POA: Diagnosis not present

## 2017-08-31 DIAGNOSIS — K219 Gastro-esophageal reflux disease without esophagitis: Secondary | ICD-10-CM | POA: Diagnosis not present

## 2017-09-07 DIAGNOSIS — Z0000 Encounter for general adult medical examination without abnormal findings: Secondary | ICD-10-CM | POA: Diagnosis not present

## 2017-09-07 DIAGNOSIS — M1711 Unilateral primary osteoarthritis, right knee: Secondary | ICD-10-CM | POA: Diagnosis not present

## 2017-09-07 DIAGNOSIS — K219 Gastro-esophageal reflux disease without esophagitis: Secondary | ICD-10-CM | POA: Diagnosis not present

## 2017-09-07 DIAGNOSIS — I10 Essential (primary) hypertension: Secondary | ICD-10-CM | POA: Diagnosis not present

## 2017-10-25 ENCOUNTER — Other Ambulatory Visit: Payer: Self-pay | Admitting: Family Medicine

## 2017-10-25 DIAGNOSIS — C61 Malignant neoplasm of prostate: Secondary | ICD-10-CM

## 2017-10-29 ENCOUNTER — Encounter: Payer: Self-pay | Admitting: Urology

## 2017-10-29 ENCOUNTER — Other Ambulatory Visit: Payer: 59

## 2017-10-31 ENCOUNTER — Encounter: Payer: Self-pay | Admitting: Urology

## 2017-10-31 ENCOUNTER — Ambulatory Visit: Payer: 59 | Admitting: Urology

## 2017-10-31 NOTE — Progress Notes (Deleted)
10/31/2017 5:49 AM   Ricky Avery 1955-04-04 [id number]  Referring provider: Tracie Avery, [facility] Avery, Ricky [address]  No chief complaint on file.   HPI: Patient is a 63 year old Hispanic male with prostate cancer, LUTS and ED who presents today for a 6 month follow-up with interpreter, Ricky Avery.    Prostate cancer Patient was diagnosed with stage I Gleason 3+4 prostate cancer involving 1 out of 6 core biopsies in 10/2013 . His presenting PSA was 3.7.  Prostate size 39.6 cc.  He underwent treatment with external beam radiation which was well tolerated. He is followed now by Ricky Avery, radiation oncology, at the cancer center.   His most recent PSA was 0.9 ng/mL on 04/2017.    His IPSS score today is ***, which is *** lower urinary tract symptomatology.  He is *** with his quality life due to his urinary symptoms.   His previous I PSS score was 1/1.  His previous PVR was 11 mL.  His major complaints today are intermittent nocturia that occurs a few times a month.  He denies any dysuria, hematuria or suprapubic pain.  He currently taking tamsulosin 0.4 mg daily.  He also denies any recent fevers, chills, nausea or vomiting.   He has had a trial with Myrbetriq, but it cause dysuria and he discontinued the medication.       Score:  1-7 Mild 8-19 Moderate 20-35 Severe   Erectile dysfunction His SHIM score is ***, which is *** ED.   He has been having difficulty with erections for several years   His previous SHIM score was 22.  His major complaint is medication is not working as quickly as it has been in the past.  His libido is preserved.   His risk factors for ED are age, pelvic radiation, prostate cancer, HLD, CAD and blood pressure medications.   He denies any painful erections or curvatures with his erections.   He is having success with PDE 5 inhibitors.   Score: 1-7 Severe ED 8-11 Moderate ED 12-16 Mild-Moderate ED 17-21  Mild ED 22-25 No ED    PMH: Past Medical History:  Diagnosis Date  . Hypercholesteremia   . Hypertension   . Prostate cancer [facility])     Surgical History: Past Surgical History:  Procedure Laterality Date  . CARDIAC CATHETERIZATION      Home Medications:  Allergies as of 10/31/2017      Reactions   No Known Allergies       Medication List        Accurate as of 10/31/17  5:49 AM. Always use your most recent med list.          aspirin 81 MG tablet Take 81 mg by mouth daily.   lisinopril 10 MG tablet Commonly known as:  PRINIVIL,ZESTRIL   metoprolol succinate 25 MG 24 hr tablet Commonly known as:  TOPROL-XL Take 25 mg by mouth daily.   Potassium 99 MG Tabs Take by mouth.   sildenafil 100 MG tablet Commonly known as:  VIAGRA TAKE 1 TABLET BY MOUTH DAILY AS NEEDED FOR ERECTILE DYSFUNCTION   sildenafil 100 MG tablet Commonly known as:  VIAGRA TAKE 1 TABLET BY MOUTH DAILY AS NEEDED FOR ERECTILE DYSFUNCTION   simvastatin 40 MG tablet Commonly known as:  ZOCOR Take 40 mg by mouth daily.   tamsulosin 0.4 MG Caps capsule Commonly known as:  FLOMAX TAKE 1 CAPSULE BY MOUTH DAILY.  Allergies:  Allergies  Allergen Reactions  . No Known Allergies     Family History: Family History  Problem Relation Age of Onset  . Kidney cancer Neg Hx   . Prostate cancer Neg Hx   . Bladder Cancer Neg Hx     Social History:  reports that he has quit smoking. He has never used smokeless tobacco. He reports that he drinks alcohol. He reports that he does not use drugs.  ROS:                                        Physical Exam: There were no vitals taken for this visit.  Constitutional: Well nourished. Alert and oriented, No acute distress. HEENT: [place] AT, moist mucus membranes. Trachea midline, no masses. Cardiovascular: No clubbing, cyanosis, or edema. Respiratory: Normal respiratory effort, no increased work of breathing. GI: Abdomen  is soft, non tender, non distended, no abdominal masses. Liver and spleen not palpable.  No hernias appreciated.  Stool sample for occult testing is not indicated.   GU: No CVA tenderness.  No bladder fullness or masses.  Patient with circumcised/uncircumcised phallus. ***Foreskin easily retracted***  Urethral meatus is patent.  No penile discharge. No penile lesions or rashes. Scrotum without lesions, cysts, rashes and/or edema.  Testicles are located scrotally bilaterally. No masses are appreciated in the testicles. Left and right epididymis are normal. Rectal: Patient with  normal sphincter tone. Anus and perineum without scarring or rashes. No rectal masses are appreciated. Prostate is approximately *** grams, *** nodules are appreciated. Seminal vesicles are normal. Skin: No rashes, bruises or suspicious lesions. Lymph: No cervical or inguinal adenopathy. Neurologic: Grossly intact, no focal deficits, moving all 4 extremities. Psychiatric: Normal mood and affect.   Laboratory Data: Lab Results  Component Value Date   PSA 1.01 04/20/2016   PSA 1.78 04/19/2015   PSA 3.75 10/12/2014  PSA 1.2 ng/mL on 02/14/2016 PSA 1.01 ng/mL on 04/20/2016 PSA 1.2 ng/mL on 08/08/2016 PSA 1.38 ng/mL on 08/29/2016 PSA 0.8 ng/mL on 01/26/2017 PSA 0.9 ng/mL on 04/30/2017 I have reviewed the labs.  Assessment & Plan:    1. Prostate cancer Patient has completed IM RT radiation therapy in 09/2014 for Gleason 7 (3+4) adenocarcinoma presenting the PSA of 3.7 - follow by Dr. Donella Stade at the cancer center as well Current PSA 0.9  RTC in 6 months for PSA - if today's PSA is stable  2. LUTS  - IPSS score is 1/1, it is improving  - Continue conservative management, avoiding bladder irritants and timed voiding's  - Continue tamsulosin 0.4 mg daily  - RTC in 6 months for I PSS and PVR  3. Erectile dysfunction  - SHIM score is 22, it is improved  - success with PDE-5 inhibitors  - RTC in 6 months for repeat  SHIM score and exam    No follow-ups on file.  These notes generated with voice recognition software. I apologize for typographical errors.  Zara Council, PA-C  [facility] [address] [place], [place] [address] [phone]

## 2017-11-07 ENCOUNTER — Other Ambulatory Visit: Payer: 59

## 2017-11-07 DIAGNOSIS — C61 Malignant neoplasm of prostate: Secondary | ICD-10-CM

## 2017-11-08 LAB — PSA: Prostate Specific Ag, Serum: 0.7 ng/mL (ref 0.0–4.0)

## 2017-11-14 ENCOUNTER — Encounter: Payer: Self-pay | Admitting: Urology

## 2017-11-14 ENCOUNTER — Ambulatory Visit (INDEPENDENT_AMBULATORY_CARE_PROVIDER_SITE_OTHER): Payer: 59 | Admitting: Urology

## 2017-11-14 VITALS — BP 150/90 | HR 58 | Ht 62.0 in | Wt 154.3 lb

## 2017-11-14 DIAGNOSIS — C61 Malignant neoplasm of prostate: Secondary | ICD-10-CM | POA: Diagnosis not present

## 2017-11-14 DIAGNOSIS — N529 Male erectile dysfunction, unspecified: Secondary | ICD-10-CM

## 2017-11-14 DIAGNOSIS — R399 Unspecified symptoms and signs involving the genitourinary system: Secondary | ICD-10-CM

## 2017-11-14 MED ORDER — TAMSULOSIN HCL 0.4 MG PO CAPS: 0.4000 mg | ORAL_CAPSULE | Freq: Every day | ORAL | 3 refills | Status: DC

## 2017-11-14 NOTE — Progress Notes (Signed)
11/14/2017 9:38 AM   Ricky Avery 26-Jun-1955 [id number]  Referring provider: Tracie Harrier, MD [address] [facility] [place], [place] [address]  Chief Complaint  Patient presents with  . Follow-up    HPI: Patient is a 62 year old Hispanic male with prostate cancer, LUTS and ED who presents today for a 6 month follow-up with interpreter, Ronnald Collum.    Prostate cancer Patient was diagnosed with stage I Gleason 3+4 prostate cancer involving 1 out of 6 core biopsies in 10/2013 . His presenting PSA was 3.7.  Prostate size 39.6 cc.  He underwent treatment with external beam radiation which was well tolerated. He is followed now by Dr. Noreene Filbert, radiation oncology, at the cancer center.   His most recent PSA was 0.7 ng/mL on 10/2017.    His IPSS score today is 4, which is mild lower urinary tract symptomatology.  He is pleased with his quality life due to his urinary symptoms.   His previous I PSS score was 1/1.  His previous PVR was 11 mL.  His major complaints today are intermittent nocturia that occurs a few times a month which is stable.  He denies any dysuria, hematuria or suprapubic pain.  He currently taking tamsulosin 0.4 mg daily.  He also denies any recent fevers, chills, nausea or vomiting.   He has had a trial with Myrbetriq, but it cause dysuria and he discontinued the medication.   IPSS    Row Name 11/14/17 0900         International Prostate Symptom Score   How often have you had the sensation of not emptying your bladder?  Not at All     How often have you had to urinate less than every two hours?  Not at All     How often have you found you stopped and started again several times when you urinated?  Not at All     How often have you found it difficult to postpone urination?  Not at All     How often have you had a weak urinary stream?  Not at All     How often have you had to strain to start urination?  Not at All     How many times did you  typically get up at night to urinate?  4 Times random     Total IPSS Score  4       Quality of Life due to urinary symptoms   If you were to spend the rest of your life with your urinary condition just the way it is now how would you feel about that?  Pleased        Score:  1-7 Mild 8-19 Moderate 20-35 Severe   Erectile dysfunction His SHIM score is 20, which is mild ED.   He has been having difficulty with erections for several years   His previous SHIM score was 22.  His major complaint is medication is not working as quickly as it has been in the past.  His libido is preserved.   His risk factors for ED are age, pelvic radiation, prostate cancer, HLD, CAD and blood pressure medications.   He denies any painful erections or curvatures with his erections.   He is having success with PDE 5 inhibitors. SHIM    Row Name 11/14/17 0915         SHIM: Over the last 6 months:   How do you rate your confidence that you  could get and keep an erection?  High     When you had erections with sexual stimulation, how often were your erections hard enough for penetration (entering your partner)?  Most Times (much more than half the time)     During sexual intercourse, how often were you able to maintain your erection after you had penetrated (entered) your partner?  Most Times (much more than half the time)     During sexual intercourse, how difficult was it to maintain your erection to completion of intercourse?  Slightly Difficult     When you attempted sexual intercourse, how often was it satisfactory for you?  Most Times (much more than half the time)       SHIM Total Score   SHIM  20        Score: 1-7 Severe ED 8-11 Moderate ED 12-16 Mild-Moderate ED 17-21 Mild ED 22-25 No ED    PMH: Past Medical History:  Diagnosis Date  . Hypercholesteremia   . Hypertension   . Prostate cancer [facility])     Surgical History: Past Surgical History:  Procedure Laterality Date  . CARDIAC  CATHETERIZATION      Home Medications:  Allergies as of 11/14/2017      Reactions   No Known Allergies       Medication List        Accurate as of 11/14/17  9:38 AM. Always use your most recent med list.          aspirin 81 MG tablet Take 81 mg by mouth daily.   lisinopril 10 MG tablet Commonly known as:  PRINIVIL,ZESTRIL   metoprolol succinate 25 MG 24 hr tablet Commonly known as:  TOPROL-XL Take 25 mg by mouth daily.   Potassium 99 MG Tabs Take by mouth.   sildenafil 100 MG tablet Commonly known as:  VIAGRA TAKE 1 TABLET BY MOUTH DAILY AS NEEDED FOR ERECTILE DYSFUNCTION   sildenafil 100 MG tablet Commonly known as:  VIAGRA TAKE 1 TABLET BY MOUTH DAILY AS NEEDED FOR ERECTILE DYSFUNCTION   simvastatin 40 MG tablet Commonly known as:  ZOCOR Take 40 mg by mouth daily.   tamsulosin 0.4 MG Caps capsule Commonly known as:  FLOMAX Take 1 capsule (0.4 mg total) by mouth daily.       Allergies:  Allergies  Allergen Reactions  . No Known Allergies     Family History: Family History  Problem Relation Age of Onset  . Kidney cancer Neg Hx   . Prostate cancer Neg Hx   . Bladder Cancer Neg Hx     Social History:  reports that he has quit smoking. He has never used smokeless tobacco. He reports that he drinks alcohol. He reports that he does not use drugs.  ROS: UROLOGY Frequent Urination?: No Hard to postpone urination?: No Burning/pain with urination?: No Get up at night to urinate?: Yes(random) Leakage of urine?: No Urine stream starts and stops?: No Trouble starting stream?: No Do you have to strain to urinate?: No Blood in urine?: No Urinary tract infection?: No Sexually transmitted disease?: No Injury to kidneys or bladder?: No Painful intercourse?: No Weak stream?: No Erection problems?: No Penile pain?: No  Gastrointestinal Nausea?: No Vomiting?: No Indigestion/heartburn?: No Diarrhea?: No Constipation?: No  Constitutional Fever:  No Night sweats?: No Weight loss?: No Fatigue?: No  Skin Skin rash/lesions?: No  Eyes Blurred vision?: No Double vision?: No  Ears/Nose/Throat Sore throat?: No Sinus problems?: No  Hematologic/Lymphatic Swollen glands?: No Easy  bruising?: No  Cardiovascular Leg swelling?: No Chest pain?: No  Respiratory Cough?: No Shortness of breath?: No  Endocrine Excessive thirst?: No  Musculoskeletal Back pain?: No Joint pain?: No  Neurological Headaches?: No Dizziness?: No  Psychologic Depression?: No Anxiety?: No  Physical Exam: BP (!) 150/90 (BP Location: Left Arm, Patient Position: Sitting, Cuff Size: Normal)   Pulse (!) 58   Ht 5\' 2"  (1.575 m)   Wt 154 lb 4.8 oz (70 kg)   BMI 28.22 kg/m   Constitutional: Well nourished. Alert and oriented, No acute distress. HEENT: [place] AT, moist mucus membranes. Trachea midline, no masses. Cardiovascular: No clubbing, cyanosis, or edema. Respiratory: Normal respiratory effort, no increased work of breathing. GI: Abdomen is soft, non tender, non distended, no abdominal masses. Liver and spleen not palpable.  No hernias appreciated.  Stool sample for occult testing is not indicated.   GU: No CVA tenderness.  No bladder fullness or masses.  Patient with uncircumcised phallus.  Foreskin easily retracted Urethral meatus is patent.  No penile discharge. No penile lesions or rashes. Scrotum without lesions, cysts, rashes and/or edema.  Testicles are located scrotally bilaterally. No masses are appreciated in the testicles. Left and right epididymis are normal. Rectal: Patient with  normal sphincter tone. Anus and perineum without scarring or rashes. No rectal masses are appreciated. Prostate is approximately 35 grams, fibrous, no nodules are appreciated. Seminal vesicles are normal. Skin: No rashes, bruises or suspicious lesions. Lymph: No cervical or inguinal adenopathy. Neurologic: Grossly intact, no focal deficits, moving all 4  extremities. Psychiatric: Normal mood and affect.   Laboratory Data: Lab Results  Component Value Date   PSA 1.01 04/20/2016   PSA 1.78 04/19/2015   PSA 3.75 10/12/2014  PSA 1.2 ng/mL on 02/14/2016 PSA 1.01 ng/mL on 04/20/2016 PSA 1.2 ng/mL on 08/08/2016 PSA 1.38 ng/mL on 08/29/2016 PSA 0.8 ng/mL on 01/26/2017 PSA 0.9 ng/mL on 04/30/2017 PSA 0.7 ng/mL on 11/07/2017 I have reviewed the labs.  Assessment & Plan:    1. Prostate cancer Patient has completed IM RT radiation therapy in 09/2014 for Gleason 7 (3+4) adenocarcinoma presenting the PSA of 3.7 - follow by Dr. Donella Stade at the cancer center as well Current PSA 0.7 RTC in 6 months for PSA   2. LUTS IPSS score is 4/1, it is improving Continue conservative management, avoiding bladder irritants and timed voiding's Continue tamsulosin 0.4 mg daily RTC in 6 months for I PSS and PVR  3. Erectile dysfunction SHIM score is 20, it is slightly worse success with PDE-5 inhibitors RTC in 6 months for repeat SHIM score and exam   4. Low platelets Patient had questions regarding some bruises after hitting a door, I explained that the bruises appeared to be what was expected after hitting his side hard with a door Reviewing his records, his platelets have been low/low normal since 2013 and to discuss this further with his PCP  Return in about 6 months (around 05/16/2018) for IPSS, SHIM, PSA and exam.  These notes generated with voice recognition software. I apologize for typographical errors.  Zara Council, PA-C  [facility] [address] [place], [place] [address] [phone]

## 2018-01-10 ENCOUNTER — Other Ambulatory Visit: Payer: Self-pay | Admitting: Urology

## 2018-01-10 DIAGNOSIS — N529 Male erectile dysfunction, unspecified: Secondary | ICD-10-CM

## 2018-01-10 NOTE — Telephone Encounter (Signed)
Ricky Avery,  Please advise on pt's refill request for Sildenafil 100mg . Last filled  01/09/17 qty #6 RF 3

## 2018-01-10 NOTE — Telephone Encounter (Signed)
Pt needs refill for Sildenafil 100 mg sent to [facility].

## 2018-01-11 MED ORDER — SILDENAFIL CITRATE 100 MG PO TABS: ORAL_TABLET | ORAL | 3 refills | Status: DC

## 2018-03-05 DIAGNOSIS — I10 Essential (primary) hypertension: Secondary | ICD-10-CM | POA: Diagnosis not present

## 2018-03-05 DIAGNOSIS — Z0000 Encounter for general adult medical examination without abnormal findings: Secondary | ICD-10-CM | POA: Diagnosis not present

## 2018-03-05 DIAGNOSIS — E7800 Pure hypercholesterolemia, unspecified: Secondary | ICD-10-CM | POA: Diagnosis not present

## 2018-03-05 DIAGNOSIS — K219 Gastro-esophageal reflux disease without esophagitis: Secondary | ICD-10-CM | POA: Diagnosis not present

## 2018-03-05 DIAGNOSIS — M1711 Unilateral primary osteoarthritis, right knee: Secondary | ICD-10-CM | POA: Diagnosis not present

## 2018-03-12 DIAGNOSIS — I10 Essential (primary) hypertension: Secondary | ICD-10-CM | POA: Diagnosis not present

## 2018-03-12 DIAGNOSIS — C61 Malignant neoplasm of prostate: Secondary | ICD-10-CM | POA: Diagnosis not present

## 2018-03-12 DIAGNOSIS — K219 Gastro-esophageal reflux disease without esophagitis: Secondary | ICD-10-CM | POA: Diagnosis not present

## 2018-03-12 DIAGNOSIS — R238 Other skin changes: Secondary | ICD-10-CM | POA: Diagnosis not present

## 2018-05-09 ENCOUNTER — Other Ambulatory Visit: Payer: Self-pay | Admitting: *Deleted

## 2018-05-09 DIAGNOSIS — C61 Malignant neoplasm of prostate: Secondary | ICD-10-CM

## 2018-05-13 ENCOUNTER — Other Ambulatory Visit: Payer: Self-pay

## 2018-05-13 ENCOUNTER — Other Ambulatory Visit: Payer: 59

## 2018-05-13 DIAGNOSIS — C61 Malignant neoplasm of prostate: Secondary | ICD-10-CM | POA: Diagnosis not present

## 2018-05-14 LAB — PSA: Prostate Specific Ag, Serum: 0.5 ng/mL (ref 0.0–4.0)

## 2018-05-15 ENCOUNTER — Encounter: Payer: Self-pay | Admitting: Urology

## 2018-05-15 ENCOUNTER — Other Ambulatory Visit: Payer: Self-pay

## 2018-05-15 ENCOUNTER — Ambulatory Visit (INDEPENDENT_AMBULATORY_CARE_PROVIDER_SITE_OTHER): Payer: 59 | Admitting: Urology

## 2018-05-15 VITALS — BP 174/103 | HR 61 | Ht 62.0 in | Wt 157.0 lb

## 2018-05-15 DIAGNOSIS — C61 Malignant neoplasm of prostate: Secondary | ICD-10-CM | POA: Diagnosis not present

## 2018-05-15 DIAGNOSIS — N529 Male erectile dysfunction, unspecified: Secondary | ICD-10-CM

## 2018-05-15 DIAGNOSIS — R399 Unspecified symptoms and signs involving the genitourinary system: Secondary | ICD-10-CM

## 2018-05-15 NOTE — Progress Notes (Signed)
05/15/2018 9:29 AM   Ricky Avery [birth year] [id number]  Referring provider: Tracie Harrier, MD [address] [facility] [place], [place] [address]  Chief Complaint  Patient presents with  . Erectile Dysfunction    HPI: 63 year old male with a personal history of prostate cancer and BPH with lower urinary symptoms who returns today for routine 63-month follow-up.  Prostate cancer Patient was diagnosed with stage I Gleason 3+4 prostate cancer involving 1 out of 6 core biopsies in 10/2013 . His presenting PSA was 3.7.  Prostate size 39.6 cc. He underwent treatment with external beam radiation which was well tolerated. He is followed now by Dr. Noreene Filbert, radiation oncology, at the cancer center.   His most recent PSA was 0.5 ng/mL on  04/2018, stable.   BPH with LUTS IPSS    Row Name 05/15/18 0900         International Prostate Symptom Score   How often have you had the sensation of not emptying your bladder?  Not at All     How often have you had to urinate less than every two hours?  Less than 1 in 5 times     How often have you found you stopped and started again several times when you urinated?  Not at All     How often have you found it difficult to postpone urination?  Not at All     How often have you had a weak urinary stream?  Not at All     How often have you had to strain to start urination?  Not at All     How many times did you typically get up at night to urinate?  3 Times     Total IPSS Score  4       Quality of Life due to urinary symptoms   If you were to spend the rest of your life with your urinary condition just the way it is now how would you feel about that?  Mostly Satisfied      Currently on Flomax 0.4 mg.  Doing well, symptoms stable.  IPSS as below.   Score:  1-7 Mild 8-19 Moderate 20-35 Severe     Erectile dysfunction  Doing well with viagra prn as needed. No side effects, tolerating well.  PMH: Past Medical History:   Diagnosis Date  . Hypercholesteremia   . Hypertension   . Prostate cancer [facility])     Surgical History: Past Surgical History:  Procedure Laterality Date  . CARDIAC CATHETERIZATION      Home Medications:  Allergies as of 05/15/2018      Reactions   No Known Allergies       Medication List       Accurate as of May 15, 2018  9:29 AM. Always use your most recent med list.        aspirin 81 MG tablet Take 81 mg by mouth daily.   lisinopril 10 MG tablet Commonly known as:  ZESTRIL   metoprolol succinate 25 MG 24 hr tablet Commonly known as:  TOPROL-XL Take 25 mg by mouth daily.   Potassium 99 MG Tabs Take by mouth.   sildenafil 100 MG tablet Commonly known as:  VIAGRA TAKE 1 TABLET BY MOUTH DAILY AS NEEDED FOR ERECTILE DYSFUNCTION   sildenafil 100 MG tablet Commonly known as:  VIAGRA TAKE 1 TABLET BY MOUTH DAILY AS NEEDED FOR ERECTILE DYSFUNCTION   simvastatin 40 MG tablet Commonly known as:  ZOCOR  Take 40 mg by mouth daily.   tamsulosin 0.4 MG Caps capsule Commonly known as:  FLOMAX Take 1 capsule (0.4 mg total) by mouth daily.       Allergies:  Allergies  Allergen Reactions  . No Known Allergies     Family History: Family History  Problem Relation Age of Onset  . Kidney cancer Neg Hx   . Prostate cancer Neg Hx   . Bladder Cancer Neg Hx     Social History:  reports that he has quit smoking. He has never used smokeless tobacco. He reports current alcohol use. He reports that he does not use drugs.  ROS: UROLOGY Frequent Urination?: No Hard to postpone urination?: No Burning/pain with urination?: No Get up at night to urinate?: No Leakage of urine?: No Urine stream starts and stops?: No Trouble starting stream?: No Do you have to strain to urinate?: No Blood in urine?: No Urinary tract infection?: No Sexually transmitted disease?: No Injury to kidneys or bladder?: No Painful intercourse?: No Weak stream?: No Erection problems?: No  Penile pain?: No  Gastrointestinal Nausea?: No Vomiting?: No Indigestion/heartburn?: Yes Diarrhea?: No Constipation?: No  Constitutional Fever: No Night sweats?: No Weight loss?: No Fatigue?: No  Skin Skin rash/lesions?: No Itching?: No  Eyes Blurred vision?: No Double vision?: No  Ears/Nose/Throat Sore throat?: No Sinus problems?: No  Hematologic/Lymphatic Swollen glands?: No Easy bruising?: No  Cardiovascular Leg swelling?: No Chest pain?: No  Respiratory Cough?: No Shortness of breath?: No  Endocrine Excessive thirst?: No  Musculoskeletal Back pain?: No Joint pain?: No  Neurological Headaches?: No Dizziness?: No  Psychologic Depression?: No Anxiety?: No  Physical Exam: BP (!) 174/103   Pulse 61   Ht 5\' 2"  (1.575 m)   Wt 157 lb (71.2 kg)   BMI 28.72 kg/m   Constitutional:  Alert and oriented, No acute distress.  Accompanied by interpreter today. HEENT: [place] AT, moist mucus membranes.  Trachea midline, no masses. Cardiovascular: No clubbing, cyanosis, or edema. Respiratory: Normal respiratory effort, no increased work of breathing. Skin: No rashes, bruises or suspicious lesions. Neurologic: Grossly intact, no focal deficits, moving all 4 extremities. Psychiatric: Normal mood and affect.  Laboratory Data: Lab Results  Component Value Date   CREATININE 0.70 05/31/2017   Component     Latest Ref Rng & Units 02/14/2016 08/08/2016 01/26/2017 04/30/2017  Prostate Specific Ag, Serum     0.0 - 4.0 ng/mL 1.2 1.2 0.8 0.9   Component     Latest Ref Rng & Units 11/07/2017 05/13/2018  Prostate Specific Ag, Serum     0.0 - 4.0 ng/mL 0.7 0.5    Pertinent Imaging: n/a  Assessment & Plan:    1. Prostate cancer ([place]) PSA stable, low Dx 2015, OK to transition to annual f/u F/u 1 year with PSA prior - PSA; Future  2. Erectile dysfunction of organic origin Continue viagra prn  3. Lower urinary tract symptoms Symptoms stable on flomax   Return  in about 1 year (around 05/15/2019) for PSA/ IPSS.  Hollice Espy, MD  [facility] [address] [place], [place] [address] [phone]

## 2018-05-16 ENCOUNTER — Other Ambulatory Visit: Payer: Self-pay

## 2018-05-22 DIAGNOSIS — I25119 Atherosclerotic heart disease of native coronary artery with unspecified angina pectoris: Secondary | ICD-10-CM | POA: Diagnosis not present

## 2018-05-22 DIAGNOSIS — Z955 Presence of coronary angioplasty implant and graft: Secondary | ICD-10-CM | POA: Diagnosis not present

## 2018-05-22 DIAGNOSIS — I208 Other forms of angina pectoris: Secondary | ICD-10-CM | POA: Diagnosis not present

## 2018-05-22 DIAGNOSIS — I10 Essential (primary) hypertension: Secondary | ICD-10-CM | POA: Diagnosis not present

## 2018-05-23 ENCOUNTER — Ambulatory Visit: Payer: Self-pay | Admitting: Radiation Oncology

## 2018-05-29 ENCOUNTER — Other Ambulatory Visit: Payer: Self-pay | Admitting: Internal Medicine

## 2018-05-29 DIAGNOSIS — I208 Other forms of angina pectoris: Secondary | ICD-10-CM

## 2018-05-29 DIAGNOSIS — I25119 Atherosclerotic heart disease of native coronary artery with unspecified angina pectoris: Secondary | ICD-10-CM

## 2018-06-06 ENCOUNTER — Other Ambulatory Visit: Payer: Self-pay

## 2018-06-06 ENCOUNTER — Ambulatory Visit
Admission: RE | Admit: 2018-06-06 | Discharge: 2018-06-06 | Disposition: A | Discharge status: Home / Self Care | Payer: 59 | Source: Ambulatory Visit | Attending: Internal Medicine | Admitting: Internal Medicine

## 2018-06-06 DIAGNOSIS — I208 Other forms of angina pectoris: Secondary | ICD-10-CM

## 2018-06-06 DIAGNOSIS — I25119 Atherosclerotic heart disease of native coronary artery with unspecified angina pectoris: Secondary | ICD-10-CM | POA: Diagnosis not present

## 2018-06-06 MED ORDER — TECHNETIUM TC 99M TETROFOSMIN IV KIT
9.7340 | PACK | Freq: Once | INTRAVENOUS | Status: AC | PRN
  Administered 2018-06-06: 9.734 via INTRAVENOUS

## 2018-06-06 MED ORDER — REGADENOSON 0.4 MG/5ML IV SOLN
0.4000 mg | Freq: Once | INTRAVENOUS | Status: AC
  Administered 2018-06-06: 0.4 mg via INTRAVENOUS
  Filled 2018-06-06: qty 5

## 2018-06-06 MED ORDER — TECHNETIUM TC 99M TETROFOSMIN IV KIT
30.0000 | PACK | Freq: Once | INTRAVENOUS | Status: AC | PRN
  Administered 2018-06-06: 32.313 via INTRAVENOUS

## 2018-06-06 NOTE — Progress Notes (Signed)
*  PRELIMINARY RESULTS* Echocardiogram 2D Echocardiogram has been performed.  Sherrie Sport 06/06/2018, 10:19 AM

## 2018-06-07 LAB — NM MYOCAR MULTI W/SPECT W/WALL MOTION / EF
Estimated workload: 1 METS
Exercise duration (min): 1 min
Exercise duration (sec): 1 s
LV dias vol: 61 mL (ref 62–150)
LV sys vol: 18 mL
MPHR: 158 {beats}/min
Peak HR: 81 {beats}/min
Percent HR: 51 %
Rest HR: 53 {beats}/min
SDS: 1
SRS: 0
SSS: 1
TID: 0.98

## 2018-06-20 DIAGNOSIS — Z955 Presence of coronary angioplasty implant and graft: Secondary | ICD-10-CM | POA: Diagnosis not present

## 2018-06-20 DIAGNOSIS — I10 Essential (primary) hypertension: Secondary | ICD-10-CM | POA: Diagnosis not present

## 2018-06-20 DIAGNOSIS — I25119 Atherosclerotic heart disease of native coronary artery with unspecified angina pectoris: Secondary | ICD-10-CM | POA: Diagnosis not present

## 2018-06-20 DIAGNOSIS — I208 Other forms of angina pectoris: Secondary | ICD-10-CM | POA: Diagnosis not present

## 2018-07-05 ENCOUNTER — Other Ambulatory Visit: Payer: Self-pay

## 2018-07-05 ENCOUNTER — Ambulatory Visit
Admission: RE | Admit: 2018-07-05 | Discharge: 2018-07-05 | Disposition: A | Discharge status: Home / Self Care | Payer: 59 | Source: Ambulatory Visit | Attending: Radiation Oncology | Admitting: Radiation Oncology

## 2018-07-05 ENCOUNTER — Encounter: Payer: Self-pay | Admitting: Radiation Oncology

## 2018-07-05 VITALS — BP 134/91 | HR 89 | Temp 97.8°F | Resp 18 | Wt 158.4 lb

## 2018-07-05 DIAGNOSIS — C61 Malignant neoplasm of prostate: Secondary | ICD-10-CM | POA: Diagnosis not present

## 2018-07-05 DIAGNOSIS — Z923 Personal history of irradiation: Secondary | ICD-10-CM | POA: Insufficient documentation

## 2018-07-05 NOTE — Progress Notes (Signed)
Radiation Oncology Follow up Note  Name: Ricky Avery   Date:   07/05/2018 MRN:  [id number] DOB: 1955-10-17    This 63 y.o. male presents to the clinic today for 4-year follow-up status post IMRT radiation therapy for Gleason 7 (3+4) adenocarcinoma the prostate.  REFERRING PROVIDER: Tracie Harrier, MD  HPI: Patient is a 63 year old male now about 4 years having completed IMRT radiation therapy to his prostate for a Gleason 7 (3+4) adenocarcinoma seen today in routine follow-up he is doing well..  He specifically denies diarrhea dysuria or any other GI/GU complaints.  His most recent PSA is 0.5.  He had a CT scan last May for left lower quadrant pain which was unremarkable.  I have reviewed that exam.  COMPLICATIONS OF TREATMENT: none  FOLLOW UP COMPLIANCE: keeps appointments   PHYSICAL EXAM:  BP (!) 134/91   Pulse 89   Temp 97.8 F (36.6 C)   Resp 18   Wt 158 lb 6.4 oz (71.9 kg)   BMI 28.97 kg/m  Well-developed well-nourished patient in NAD. HEENT reveals PERLA, EOMI, discs not visualized.  Oral cavity is clear. No oral mucosal lesions are identified. Neck is clear without evidence of cervical or supraclavicular adenopathy. Lungs are clear to A&P. Cardiac examination is essentially unremarkable with regular rate and rhythm without murmur rub or thrill. Abdomen is benign with no organomegaly or masses noted. Motor sensory and DTR levels are equal and symmetric in the upper and lower extremities. Cranial nerves II through XII are grossly intact. Proprioception is intact. No peripheral adenopathy or edema is identified. No motor or sensory levels are noted. Crude visual fields are within normal range.  RADIOLOGY RESULTS: CT scan reviewed showing no evidence of disease compatible with above-stated findings  PLAN: Present time patient is under excellent biochemical control of his prostate cancer.  I am pleased with his overall progress.  I have asked to see him back in 1 year for follow-up  with a PSA at that time.  I will then discontinue follow-up care.  Patient knows to call at anytime with any concerns.  I would like to take this opportunity to thank you for allowing me to participate in the care of your patient.Noreene Filbert, MD

## 2018-07-31 ENCOUNTER — Other Ambulatory Visit: Payer: Self-pay | Admitting: Family Medicine

## 2018-07-31 DIAGNOSIS — N529 Male erectile dysfunction, unspecified: Secondary | ICD-10-CM

## 2018-07-31 MED ORDER — SILDENAFIL CITRATE 100 MG PO TABS
ORAL_TABLET | ORAL | 3 refills | Status: DC
Start: 2018-07-31 — End: 2020-05-14

## 2018-08-15 ENCOUNTER — Other Ambulatory Visit: Payer: Self-pay | Admitting: Internal Medicine

## 2018-08-15 DIAGNOSIS — Z20822 Contact with and (suspected) exposure to covid-19: Secondary | ICD-10-CM

## 2018-09-03 DIAGNOSIS — C61 Malignant neoplasm of prostate: Secondary | ICD-10-CM | POA: Diagnosis not present

## 2018-09-03 DIAGNOSIS — K219 Gastro-esophageal reflux disease without esophagitis: Secondary | ICD-10-CM | POA: Diagnosis not present

## 2018-09-03 DIAGNOSIS — I10 Essential (primary) hypertension: Secondary | ICD-10-CM | POA: Diagnosis not present

## 2018-09-03 DIAGNOSIS — R238 Other skin changes: Secondary | ICD-10-CM | POA: Diagnosis not present

## 2018-09-10 DIAGNOSIS — Z0000 Encounter for general adult medical examination without abnormal findings: Secondary | ICD-10-CM | POA: Diagnosis not present

## 2018-09-10 DIAGNOSIS — Z23 Encounter for immunization: Secondary | ICD-10-CM | POA: Diagnosis not present

## 2018-09-10 DIAGNOSIS — M25562 Pain in left knee: Secondary | ICD-10-CM | POA: Diagnosis not present

## 2018-09-10 DIAGNOSIS — R739 Hyperglycemia, unspecified: Secondary | ICD-10-CM | POA: Diagnosis not present

## 2018-09-10 DIAGNOSIS — I10 Essential (primary) hypertension: Secondary | ICD-10-CM | POA: Diagnosis not present

## 2018-09-16 DIAGNOSIS — H524 Presbyopia: Secondary | ICD-10-CM | POA: Diagnosis not present

## 2018-09-24 DIAGNOSIS — B351 Tinea unguium: Secondary | ICD-10-CM | POA: Diagnosis not present

## 2018-09-24 DIAGNOSIS — I10 Essential (primary) hypertension: Secondary | ICD-10-CM | POA: Diagnosis not present

## 2019-01-01 DIAGNOSIS — I10 Essential (primary) hypertension: Secondary | ICD-10-CM | POA: Diagnosis not present

## 2019-01-01 DIAGNOSIS — C61 Malignant neoplasm of prostate: Secondary | ICD-10-CM | POA: Diagnosis not present

## 2019-01-01 DIAGNOSIS — L03031 Cellulitis of right toe: Secondary | ICD-10-CM | POA: Diagnosis not present

## 2019-01-01 DIAGNOSIS — Z0000 Encounter for general adult medical examination without abnormal findings: Secondary | ICD-10-CM | POA: Diagnosis not present

## 2019-01-01 DIAGNOSIS — M25562 Pain in left knee: Secondary | ICD-10-CM | POA: Diagnosis not present

## 2019-01-01 DIAGNOSIS — B49 Unspecified mycosis: Secondary | ICD-10-CM | POA: Diagnosis not present

## 2019-01-01 DIAGNOSIS — R739 Hyperglycemia, unspecified: Secondary | ICD-10-CM | POA: Diagnosis not present

## 2019-01-08 DIAGNOSIS — M542 Cervicalgia: Secondary | ICD-10-CM | POA: Diagnosis not present

## 2019-01-08 DIAGNOSIS — K219 Gastro-esophageal reflux disease without esophagitis: Secondary | ICD-10-CM | POA: Diagnosis not present

## 2019-01-08 DIAGNOSIS — I10 Essential (primary) hypertension: Secondary | ICD-10-CM | POA: Diagnosis not present

## 2019-01-08 DIAGNOSIS — C61 Malignant neoplasm of prostate: Secondary | ICD-10-CM | POA: Diagnosis not present

## 2019-02-13 ENCOUNTER — Encounter: Payer: Self-pay | Admitting: *Deleted

## 2019-04-30 DIAGNOSIS — I10 Essential (primary) hypertension: Secondary | ICD-10-CM | POA: Diagnosis not present

## 2019-04-30 DIAGNOSIS — R7309 Other abnormal glucose: Secondary | ICD-10-CM | POA: Diagnosis not present

## 2019-04-30 DIAGNOSIS — M542 Cervicalgia: Secondary | ICD-10-CM | POA: Diagnosis not present

## 2019-04-30 DIAGNOSIS — C61 Malignant neoplasm of prostate: Secondary | ICD-10-CM | POA: Diagnosis not present

## 2019-04-30 DIAGNOSIS — K219 Gastro-esophageal reflux disease without esophagitis: Secondary | ICD-10-CM | POA: Diagnosis not present

## 2019-05-06 ENCOUNTER — Other Ambulatory Visit: Payer: 59

## 2019-05-06 ENCOUNTER — Other Ambulatory Visit: Payer: Self-pay

## 2019-05-06 DIAGNOSIS — C61 Malignant neoplasm of prostate: Secondary | ICD-10-CM | POA: Diagnosis not present

## 2019-05-07 DIAGNOSIS — M5416 Radiculopathy, lumbar region: Secondary | ICD-10-CM | POA: Diagnosis not present

## 2019-05-07 DIAGNOSIS — M1711 Unilateral primary osteoarthritis, right knee: Secondary | ICD-10-CM | POA: Diagnosis not present

## 2019-05-07 DIAGNOSIS — I10 Essential (primary) hypertension: Secondary | ICD-10-CM | POA: Diagnosis not present

## 2019-05-07 DIAGNOSIS — K219 Gastro-esophageal reflux disease without esophagitis: Secondary | ICD-10-CM | POA: Diagnosis not present

## 2019-05-07 LAB — PSA: Prostate Specific Ag, Serum: 0.2 ng/mL (ref 0.0–4.0)

## 2019-05-15 ENCOUNTER — Ambulatory Visit (INDEPENDENT_AMBULATORY_CARE_PROVIDER_SITE_OTHER): Payer: 59 | Admitting: Urology

## 2019-05-15 ENCOUNTER — Other Ambulatory Visit: Payer: Self-pay

## 2019-05-15 ENCOUNTER — Encounter: Payer: Self-pay | Admitting: Urology

## 2019-05-15 VITALS — BP 160/91 | HR 65 | Ht 60.0 in | Wt 160.0 lb

## 2019-05-15 DIAGNOSIS — N529 Male erectile dysfunction, unspecified: Secondary | ICD-10-CM

## 2019-05-15 DIAGNOSIS — N401 Enlarged prostate with lower urinary tract symptoms: Secondary | ICD-10-CM | POA: Diagnosis not present

## 2019-05-15 DIAGNOSIS — C61 Malignant neoplasm of prostate: Secondary | ICD-10-CM | POA: Diagnosis not present

## 2019-05-15 DIAGNOSIS — N138 Other obstructive and reflux uropathy: Secondary | ICD-10-CM | POA: Diagnosis not present

## 2019-05-15 MED ORDER — TADALAFIL 20 MG PO TABS: 20.0000 mg | ORAL_TABLET | Freq: Every day | ORAL | 2 refills | Status: DC | PRN

## 2019-05-15 NOTE — Progress Notes (Signed)
05/15/2019 8:46 AM   Ricky Avery 1955-06-02 [id number]  Referring provider: Tracie Harrier, MD [address] [facility] [place],  [place] [address]  Chief Complaint  Patient presents with  . Prostate Cancer    HPI: Mr. Ricky Avery is a 64 year old male with prostate cancer, BPH with LU TS and ED who presents today for follow up with interpreter, Ronnald Collum.   Prostate cancer Patient was diagnosed with stage I Gleason 3+4 prostate cancer involving 1 out of 6 core biopsies in 10/2013 . His presenting PSA was 3.7. Prostate size 39.6 cc. He underwent treatment with external beam radiation which was well tolerated. He is followed now by Dr. Noreene Filbert, radiation oncology, at the cancer center.   Component     Latest Ref Rng & Units 02/14/2016 08/08/2016 01/26/2017 04/30/2017  Prostate Specific Ag, Serum     0.0 - 4.0 ng/mL 1.2 1.2 0.8 0.9   Component     Latest Ref Rng & Units 11/07/2017 05/13/2018 05/06/2019  Prostate Specific Ag, Serum     0.0 - 4.0 ng/mL 0.7 0.5 0.2    BPH WITH LUTS  (prostate and/or bladder) IPSS score: 3/1    Previous score: 4/1   Previous PVR: 11 mL  Major complaint(s):  Very mild post-void dribbling.  Deniies any dysuria, hematuria or suprapubic pain.   Currently taking: tamsulosin 0.4 mg daily   Denies any recent fevers, chills, nausea or vomiting.   IPSS    Row Name 05/15/19 0800         International Prostate Symptom Score   How often have you had the sensation of not emptying your bladder?  Not at All     How often have you had to urinate less than every two hours?  Not at All     How often have you found you stopped and started again several times when you urinated?  Not at All     How often have you found it difficult to postpone urination?  Not at All     How often have you had a weak urinary stream?  Not at All     How often have you had to strain to start urination?  Not at All     How many times did you typically get up at  night to urinate?  3 Times     Total IPSS Score  3       Quality of Life due to urinary symptoms   If you were to spend the rest of your life with your urinary condition just the way it is now how would you feel about that?  Pleased        Score:  1-7 Mild 8-19 Moderate 20-35 Severe   Erectile dysfunction Main complaint: Maintaining an erection x several years Risk factors:  age, pelvic radiation, BPH, prostate cancer, HTN and HLD   No painful erections or curvatures with his erections.    Still having spontaneous erections Tried:   Viagra.  He is taking it four hours prior to intercourse with limited effect.    PMH: Past Medical History:  Diagnosis Date  . Hypercholesteremia   . Hypertension   . Prostate cancer [facility])     Surgical History: Past Surgical History:  Procedure Laterality Date  . CARDIAC CATHETERIZATION      Home Medications:  Allergies as of 05/15/2019      Reactions   No Known Allergies  Medication List       Accurate as of May 15, 2019  8:46 AM. If you have any questions, ask your nurse or doctor.        aspirin 81 MG tablet Take 81 mg by mouth daily.   ergocalciferol 1.25 MG (50000 UT) capsule Commonly known as: VITAMIN D2 Take by mouth.   lisinopril 40 MG tablet Commonly known as: ZESTRIL Take 40 mg by mouth daily.   meloxicam 15 MG tablet Commonly known as: MOBIC   metoprolol succinate 25 MG 24 hr tablet Commonly known as: TOPROL-XL Take 25 mg by mouth daily.   Potassium 99 MG Tabs Take by mouth.   sildenafil 100 MG tablet Commonly known as: VIAGRA TAKE 1 TABLET BY MOUTH DAILY AS NEEDED FOR ERECTILE DYSFUNCTION   sildenafil 100 MG tablet Commonly known as: VIAGRA TAKE 1 TABLET BY MOUTH DAILY AS NEEDED FOR ERECTILE DYSFUNCTION   simvastatin 40 MG tablet Commonly known as: ZOCOR Take 40 mg by mouth daily.   tadalafil 20 MG tablet Commonly known as: CIALIS Take 1 tablet (20 mg total) by mouth daily as needed for  erectile dysfunction. Started by: Zara Council, PA-C   tamsulosin 0.4 MG Caps capsule Commonly known as: FLOMAX Take 1 capsule (0.4 mg total) by mouth daily.       Allergies:  Allergies  Allergen Reactions  . No Known Allergies     Family History: Family History  Problem Relation Age of Onset  . Kidney cancer Neg Hx   . Prostate cancer Neg Hx   . Bladder Cancer Neg Hx     Social History:  reports that he has quit smoking. He has never used smokeless tobacco. He reports current alcohol use. He reports that he does not use drugs.  ROS: Pertinent ROS in HPI  Physical Exam: BP (!) 160/91   Pulse 65   Ht 5' (1.524 m)   Wt 160 lb (72.6 kg)   BMI 31.25 kg/m   Constitutional:  Well nourished. Alert and oriented, No acute distress. HEENT: [place] AT, mask in place.  Trachea midline Cardiovascular: No clubbing, cyanosis, or edema. Respiratory: Normal respiratory effort, no increased work of breathing. Neurologic: Grossly intact, no focal deficits, moving all 4 extremities. Psychiatric: Normal mood and affect.  Laboratory Data: Lab Results  Component Value Date   WBC 4.3 05/09/2011   HGB 14.9 05/09/2011   HCT 44.7 05/09/2011   MCV 93 05/09/2011   PLT 114 (L) 05/09/2011    Lab Results  Component Value Date   CREATININE 0.70 05/31/2017    Lab Results  Component Value Date   PSA 1.01 04/20/2016   PSA 1.78 04/19/2015   PSA 3.75 10/12/2014    Lab Results  Component Value Date   AST 31 05/09/2011   Lab Results  Component Value Date   ALT 37 05/09/2011    Urinalysis    Component Value Date/Time   APPEARANCEUR Clear 08/04/2014 0835   GLUCOSEU Negative 08/04/2014 0835   BILIRUBINUR Negative 08/04/2014 0835   PROTEINUR 1+ (A) 08/04/2014 0835   NITRITE Negative 08/04/2014 0835   LEUKOCYTESUR Negative 08/04/2014 0835    I have reviewed the labs.   Pertinent Imaging: No recent imaging  Assessment & Plan:    1. Prostate cancer - PSA responding  appropriate to treatment - has follow up with Dr. Donella Stade in 06/2019 - RTC in one year for PSA  2. BPH with LUTS IPSS score is 3/1, it is improving Continue conservative management,  avoiding bladder irritants and timed voiding's Most bothersome symptoms is/are mild post-void dribbling Continue tamsulosin 0.4 mg daily, refills given RTC in 12 months for I PSS, PSA and exam  3. Erectile dysfunction We discussed taking the sildenafil closer to the time he is wanting to have intercourse or to switch to tadalafil which has a longer half life He would like to try tadalafil 20 mg on demand dosing Script given for tadalafil 20 mg, take one tablet prior to intercourse, do not take with nitrates, # 30 with 2 refills - RTC in 12 months for repeat SHIM score and exam   Return in about 1 year (around 05/14/2020) for IPSS, SHIM, PSA and exam.  These notes generated with voice recognition software. I apologize for typographical errors.  Zara Council, PA-C  [facility] [address]  [place], [place] [address] [phone]

## 2019-05-26 DIAGNOSIS — E7800 Pure hypercholesterolemia, unspecified: Secondary | ICD-10-CM | POA: Diagnosis not present

## 2019-05-26 DIAGNOSIS — I2510 Atherosclerotic heart disease of native coronary artery without angina pectoris: Secondary | ICD-10-CM | POA: Diagnosis not present

## 2019-05-26 DIAGNOSIS — I10 Essential (primary) hypertension: Secondary | ICD-10-CM | POA: Diagnosis not present

## 2019-05-26 DIAGNOSIS — I214 Non-ST elevation (NSTEMI) myocardial infarction: Secondary | ICD-10-CM | POA: Diagnosis not present

## 2019-06-24 ENCOUNTER — Telehealth: Payer: Self-pay | Admitting: Urology

## 2019-06-24 DIAGNOSIS — N529 Male erectile dysfunction, unspecified: Secondary | ICD-10-CM

## 2019-06-24 NOTE — Telephone Encounter (Signed)
Spoke to patient through Interpreter services Nevin Bloodgood ID# [phone]) notified patient Cialis was sent ot pharmacy in April and he is to try this medication. Patient has not picked up the medication but he will get it and try it.

## 2019-06-24 NOTE — Telephone Encounter (Signed)
Pt came into office and needs refill for Sildenafil 100 mg sent to [facility] outpatient pharmacy.

## 2019-06-25 ENCOUNTER — Other Ambulatory Visit: Payer: Self-pay | Admitting: Urology

## 2019-06-25 DIAGNOSIS — N529 Male erectile dysfunction, unspecified: Secondary | ICD-10-CM

## 2019-06-25 NOTE — Telephone Encounter (Signed)
I would like some clarification as to which PDE 5 inhibitor Mr. Ricky Avery is currently taking for his erectile dysfunction.  I have note stating that he takes sildenafil 20 mg and also prescription for trial of tadalafil and now a request for 100 mg sildenafil.

## 2019-06-25 NOTE — Telephone Encounter (Signed)
Fatina from [facility] outpatient pharmacy called stating patient would like to stay with Viagra 100mg  since the copay is only $5. Verbal refill was given over the phone. Viagra 100mg  take one tablet daily PRN ED #6 w/3refills

## 2019-07-04 ENCOUNTER — Ambulatory Visit: Payer: 59 | Admitting: Radiation Oncology

## 2019-07-11 ENCOUNTER — Other Ambulatory Visit: Payer: Self-pay

## 2019-07-11 ENCOUNTER — Ambulatory Visit
Admission: RE | Admit: 2019-07-11 | Discharge: 2019-07-11 | Disposition: A | Discharge status: Home / Self Care | Payer: 59 | Source: Ambulatory Visit | Attending: Radiation Oncology | Admitting: Radiation Oncology

## 2019-07-11 VITALS — BP 148/98 | HR 65 | Temp 95.9°F | Resp 16 | Wt 163.3 lb

## 2019-07-11 DIAGNOSIS — C61 Malignant neoplasm of prostate: Secondary | ICD-10-CM | POA: Diagnosis not present

## 2019-07-11 DIAGNOSIS — Z923 Personal history of irradiation: Secondary | ICD-10-CM | POA: Diagnosis not present

## 2019-07-11 NOTE — Progress Notes (Signed)
Radiation Oncology Follow up Note  Name: Ricky Avery   Date:   07/11/2019 MRN:  [id number] DOB: February 16, 1955    This 64 y.o. male presents to the clinic today for 5-year follow-up status post IMRT radiation therapy for Gleason 7 (3+4 adenocarcinoma the prostate.  REFERRING PROVIDER: Tracie Harrier, MD  HPI: Patient is a 64 year old Spanish male now at 5 years from IMRT radiation therapy to his prostate for Gleason 7 adenocarcinoma seen today in routine follow-up he is doing well specifically denies any increased lower urinary tract symptoms diarrhea or fatigue.  His PSA is now 0.2 down from 0.5 a year ago..  COMPLICATIONS OF TREATMENT: none  FOLLOW UP COMPLIANCE: keeps appointments   PHYSICAL EXAM:  BP (!) 148/98 (BP Location: Left Arm, Patient Position: Sitting)   Pulse 65   Temp (!) 95.9 F (35.5 C) (Tympanic)   Resp 16   Wt 163 lb 4.8 oz (74.1 kg)   BMI 31.89 kg/m  Well-developed well-nourished patient in NAD. HEENT reveals PERLA, EOMI, discs not visualized.  Oral cavity is clear. No oral mucosal lesions are identified. Neck is clear without evidence of cervical or supraclavicular adenopathy. Lungs are clear to A&P. Cardiac examination is essentially unremarkable with regular rate and rhythm without murmur rub or thrill. Abdomen is benign with no organomegaly or masses noted. Motor sensory and DTR levels are equal and symmetric in the upper and lower extremities. Cranial nerves II through XII are grossly intact. Proprioception is intact. No peripheral adenopathy or edema is identified. No motor or sensory levels are noted. Crude visual fields are within normal range.  RADIOLOGY RESULTS: No current films to review  PLAN: Present time he is now 5 years out with PSA continue to track down.  I am going to discontinue follow-up care I have asked him to have his family doctor run a PSA every year and to contact me should there be any problems.  Patient is satisfied with that treatment plan.   Interpreter was present throughout my visit.  Patient knows to call with any concerns.  I would like to take this opportunity to thank you for allowing me to participate in the care of your patient.Noreene Filbert, MD

## 2019-08-07 IMAGING — CT CT ABD-PELV W/ CM
2 of 5 series · 16 of 46 positions shown, 18 images · IV contrast (APPLIED)
Comparison: None.

CLINICAL DATA: Left lower quadrant pain for several months, history
of prostate carcinoma

EXAM:
CT ABDOMEN AND PELVIS WITH CONTRAST
TECHNIQUE: Multidetector CT imaging of the abdomen and pelvis was performed
using the standard protocol following bolus administration of
intravenous contrast.
CONTRAST:  75mL [id number] IOPAMIDOL ([id number]) INJECTION 76%

[Series 2: axial st · axial · 0.74mm/px · z∈[-880,-480]mm · 13 of 90 slices shown, 15 images]
[im 5/90  soft-tissue]
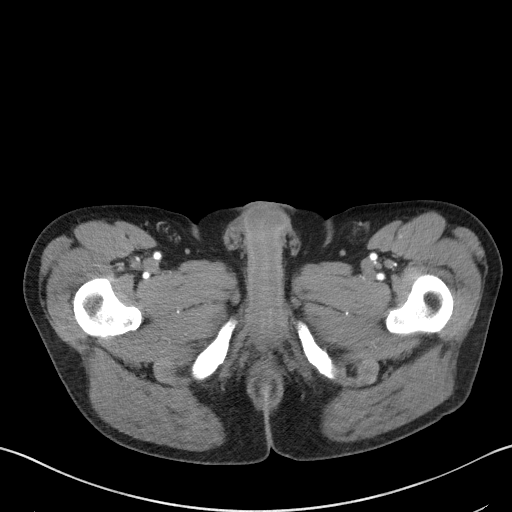
[im 5/90  bone]
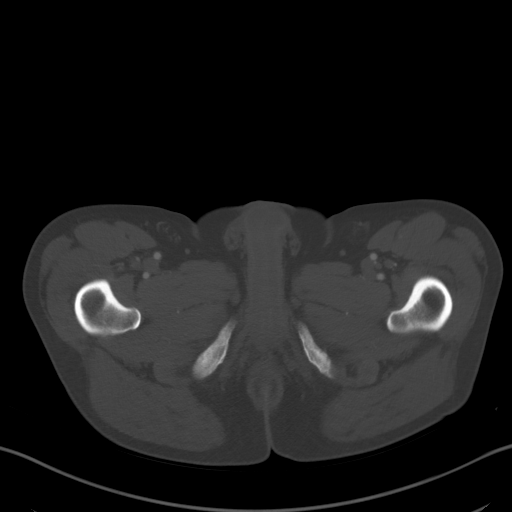
[im 15/90  soft-tissue]
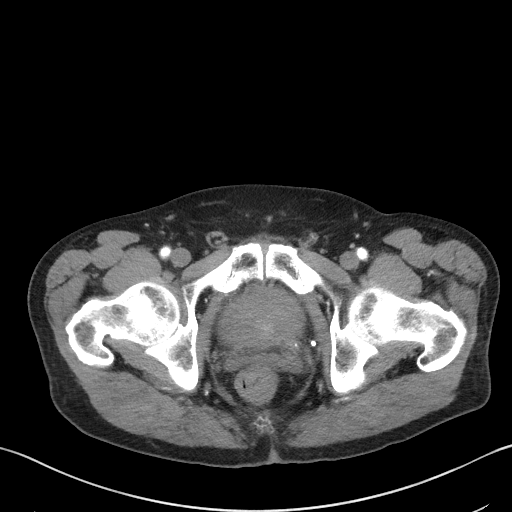
[im 19/90  soft-tissue]
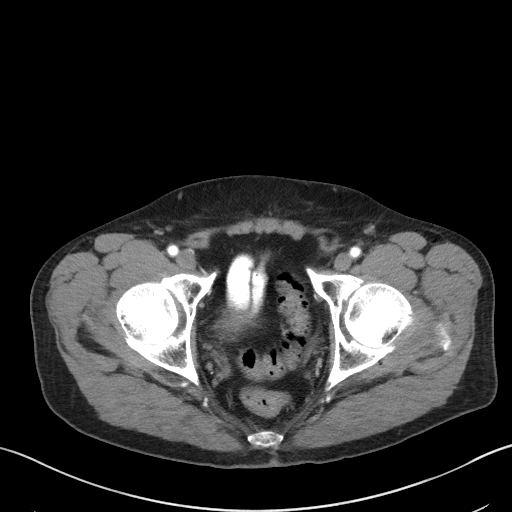
[im 24/90  soft-tissue]
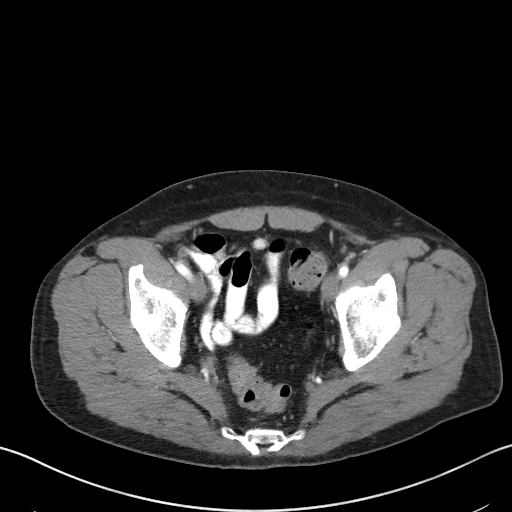
[im 33/90  soft-tissue]
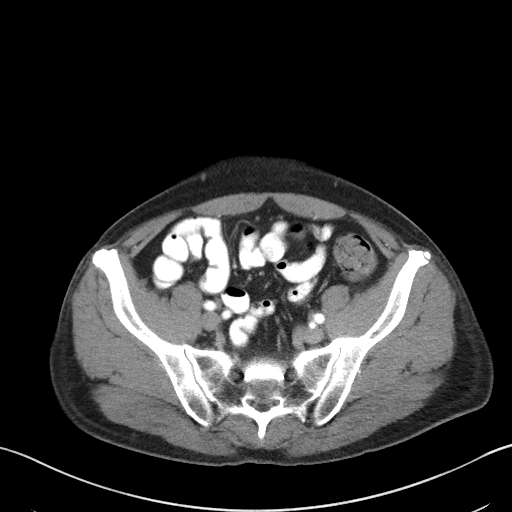
[im 38/90  soft-tissue]
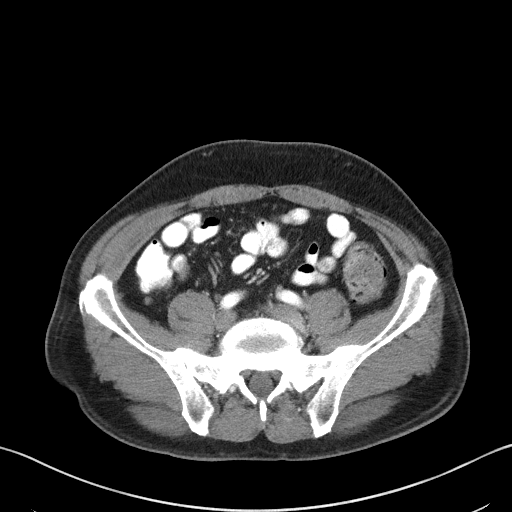
[im 47/90  soft-tissue]
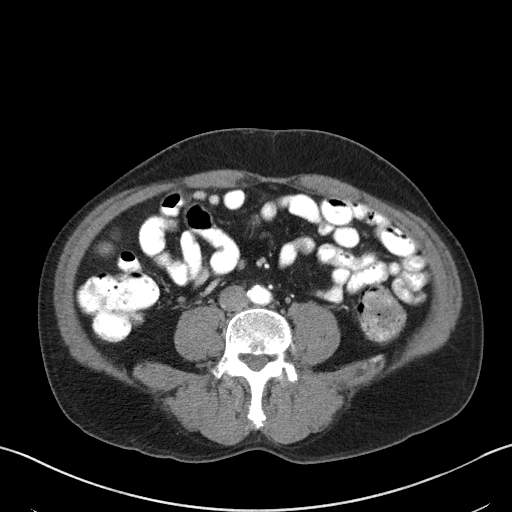
[im 52/90  soft-tissue]
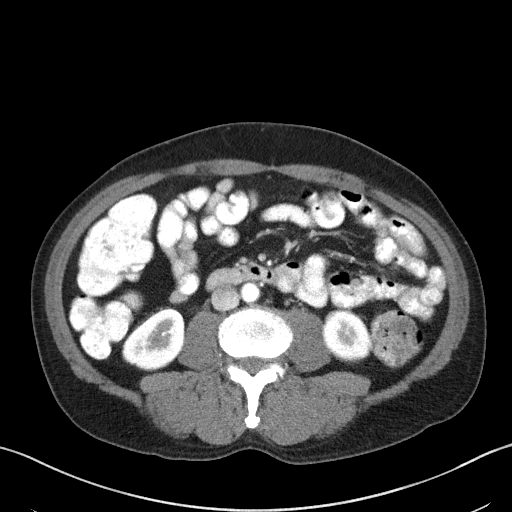
[im 57/90  soft-tissue]
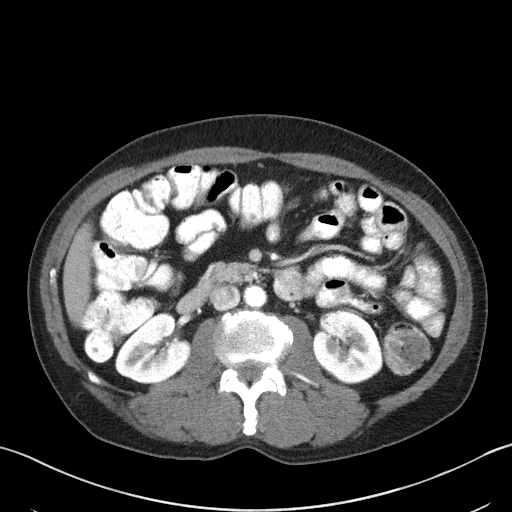
[im 57/90  bone]
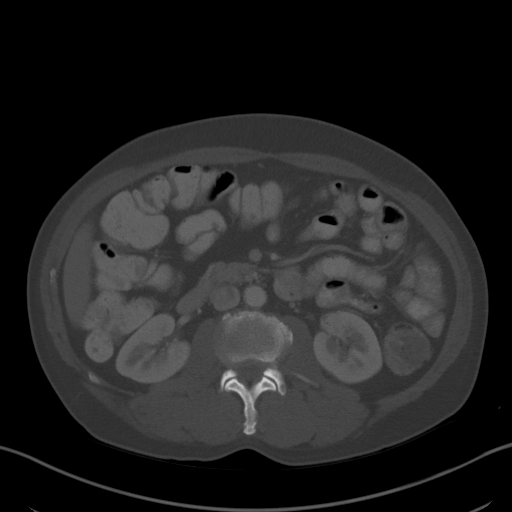
[im 66/90  soft-tissue]
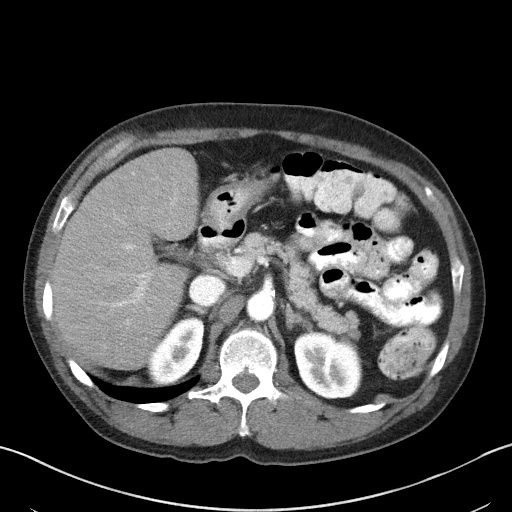
[im 71/90  soft-tissue]
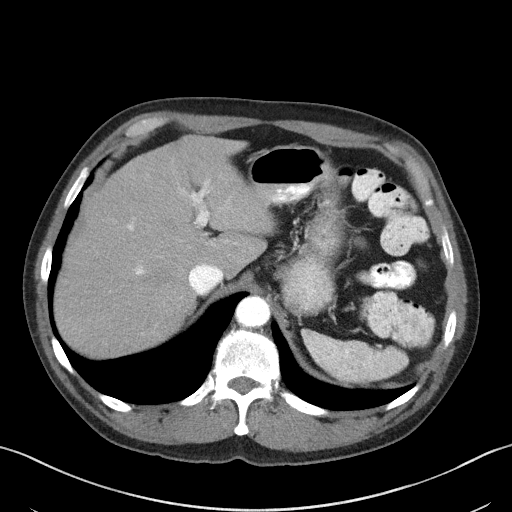
[im 75/90  soft-tissue]
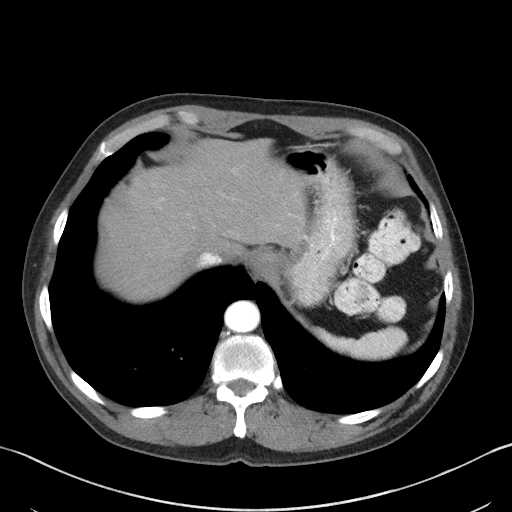
[im 85/90  soft-tissue]
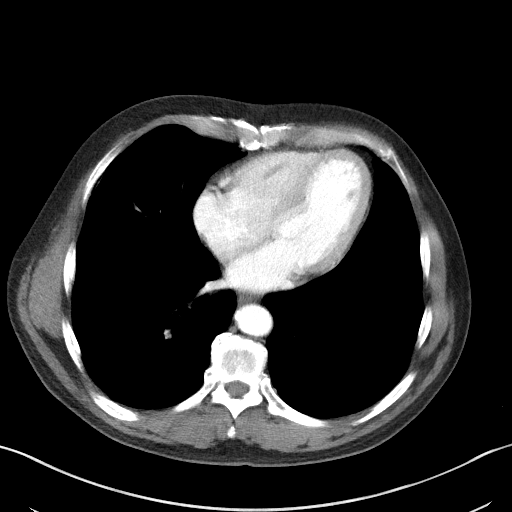

[Series 5: coronal st · coronal · 0.68mm/px · 3 of 89 slices shown]
[im 30/89  soft-tissue]
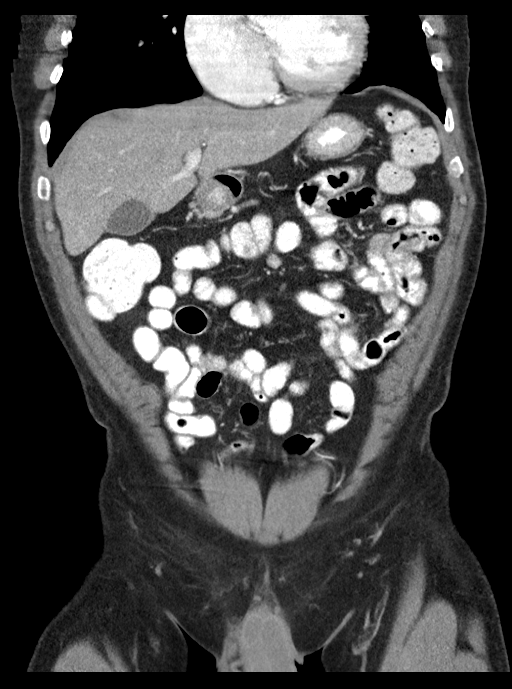
[im 40/89  soft-tissue]
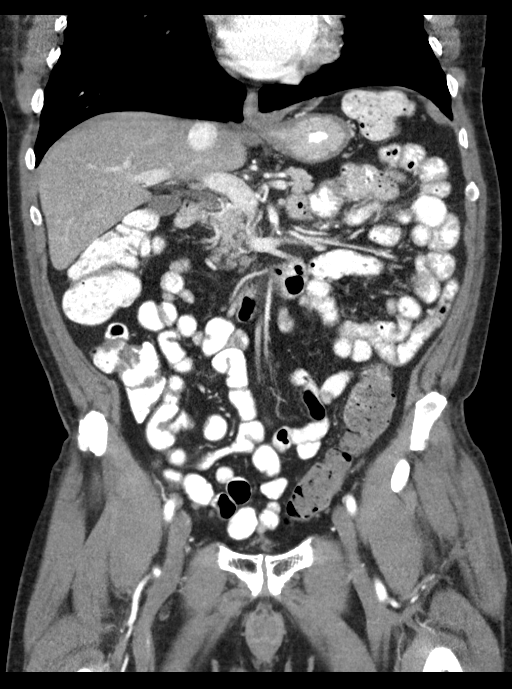
[im 49/89  soft-tissue]
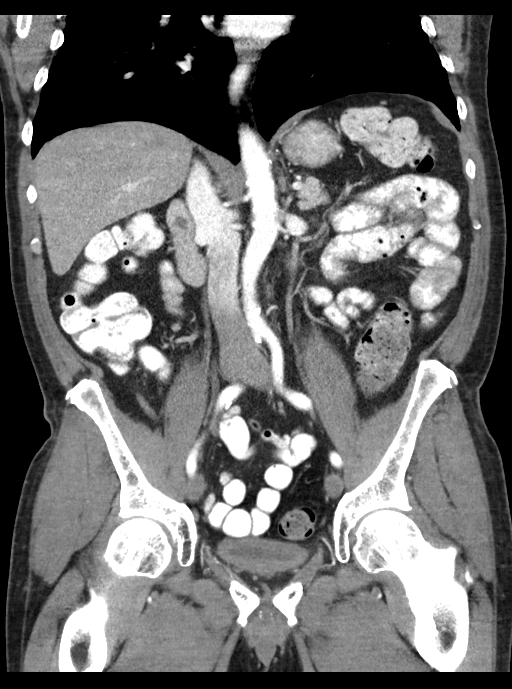

[16 of 46 positions shown; findings below may reference images not displayed]

FINDINGS: Lower chest: No acute abnormality.

Hepatobiliary: No focal liver abnormality is seen. No gallstones,
gallbladder wall thickening, or biliary dilatation.

Pancreas: Unremarkable. No pancreatic ductal dilatation or
surrounding inflammatory changes.

Spleen: Splenic granuloma is noted. No other focal abnormality is
seen.

Adrenals/Urinary Tract: The adrenal glands are within normal limits.
The kidneys are well visualize without renal calculi or obstructive
changes. The bladder is collapsed..

Stomach/Bowel: Stomach is within normal limits. Appendix appears
normal. No evidence of bowel wall thickening, distention, or
inflammatory changes.

Vascular/Lymphatic: Aortic atherosclerosis. No enlarged abdominal or
pelvic lymph nodes.

Reproductive: The prostate is prominent. Apparent fiducial markers
are noted.

Other: No abdominal wall hernia or abnormality. No abdominopelvic
ascites.

Musculoskeletal: Mild degenerative change of the lumbar spine is
seen. No findings to suggest metastatic disease are noted.
IMPRESSION: Chronic changes without acute abnormality.

## 2019-09-12 DIAGNOSIS — M1711 Unilateral primary osteoarthritis, right knee: Secondary | ICD-10-CM | POA: Diagnosis not present

## 2019-09-12 DIAGNOSIS — I10 Essential (primary) hypertension: Secondary | ICD-10-CM | POA: Diagnosis not present

## 2019-09-12 DIAGNOSIS — R739 Hyperglycemia, unspecified: Secondary | ICD-10-CM | POA: Diagnosis not present

## 2019-09-12 DIAGNOSIS — K219 Gastro-esophageal reflux disease without esophagitis: Secondary | ICD-10-CM | POA: Diagnosis not present

## 2019-09-12 DIAGNOSIS — C61 Malignant neoplasm of prostate: Secondary | ICD-10-CM | POA: Diagnosis not present

## 2019-09-12 DIAGNOSIS — M5416 Radiculopathy, lumbar region: Secondary | ICD-10-CM | POA: Diagnosis not present

## 2019-09-19 ENCOUNTER — Other Ambulatory Visit: Payer: Self-pay | Admitting: Internal Medicine

## 2019-09-19 DIAGNOSIS — Z0000 Encounter for general adult medical examination without abnormal findings: Secondary | ICD-10-CM | POA: Diagnosis not present

## 2019-09-19 DIAGNOSIS — M76899 Other specified enthesopathies of unspecified lower limb, excluding foot: Secondary | ICD-10-CM | POA: Diagnosis not present

## 2019-09-19 DIAGNOSIS — C61 Malignant neoplasm of prostate: Secondary | ICD-10-CM | POA: Diagnosis not present

## 2019-09-19 DIAGNOSIS — I10 Essential (primary) hypertension: Secondary | ICD-10-CM | POA: Diagnosis not present

## 2020-01-15 ENCOUNTER — Other Ambulatory Visit: Payer: Self-pay | Admitting: Internal Medicine

## 2020-03-26 DIAGNOSIS — M76899 Other specified enthesopathies of unspecified lower limb, excluding foot: Secondary | ICD-10-CM | POA: Diagnosis not present

## 2020-03-26 DIAGNOSIS — E7800 Pure hypercholesterolemia, unspecified: Secondary | ICD-10-CM | POA: Diagnosis not present

## 2020-03-26 DIAGNOSIS — Z0000 Encounter for general adult medical examination without abnormal findings: Secondary | ICD-10-CM | POA: Diagnosis not present

## 2020-03-26 DIAGNOSIS — R739 Hyperglycemia, unspecified: Secondary | ICD-10-CM | POA: Diagnosis not present

## 2020-03-26 DIAGNOSIS — C61 Malignant neoplasm of prostate: Secondary | ICD-10-CM | POA: Diagnosis not present

## 2020-03-26 DIAGNOSIS — R7989 Other specified abnormal findings of blood chemistry: Secondary | ICD-10-CM | POA: Diagnosis not present

## 2020-03-26 DIAGNOSIS — I10 Essential (primary) hypertension: Secondary | ICD-10-CM | POA: Diagnosis not present

## 2020-04-06 DIAGNOSIS — R739 Hyperglycemia, unspecified: Secondary | ICD-10-CM | POA: Diagnosis not present

## 2020-04-06 DIAGNOSIS — I10 Essential (primary) hypertension: Secondary | ICD-10-CM | POA: Diagnosis not present

## 2020-04-06 DIAGNOSIS — C61 Malignant neoplasm of prostate: Secondary | ICD-10-CM | POA: Diagnosis not present

## 2020-04-06 DIAGNOSIS — K219 Gastro-esophageal reflux disease without esophagitis: Secondary | ICD-10-CM | POA: Diagnosis not present

## 2020-04-27 ENCOUNTER — Other Ambulatory Visit: Payer: Self-pay

## 2020-04-27 DIAGNOSIS — C61 Malignant neoplasm of prostate: Secondary | ICD-10-CM

## 2020-05-10 ENCOUNTER — Other Ambulatory Visit: Payer: Self-pay

## 2020-05-10 ENCOUNTER — Other Ambulatory Visit: Payer: 59

## 2020-05-10 DIAGNOSIS — C61 Malignant neoplasm of prostate: Secondary | ICD-10-CM | POA: Diagnosis not present

## 2020-05-11 LAB — PSA: Prostate Specific Ag, Serum: 0.2 ng/mL (ref 0.0–4.0)

## 2020-05-13 NOTE — Progress Notes (Shared)
05/13/2020  11:14 AM   Ricky Avery 04-25-1955 [id number]  Referring provider: Tracie Harrier, [place] [facility] [place],  [place] [address] No chief complaint on file.  Urological History: 1. Prostate cancer - PSA responding well to treatment - Diagnosed with state I Gleason 3+4 prostate cancer involving 1/6 core biopsies (10/2013) - PSA trend is below: 1.2 - 02/14/2016 1.2 - 08/08/2016 0.8 - 01/26/2017 0.9 - 04/30/2017 0.7 - 11/07/2017 0.5 - 05/13/2018 0.2 - 05/06/2019 0.2 - 05/10/2020  2. BPH with LUTS (prostate and/or bladder) - IPSS score improving - Conservatively managed by avoiding bladder irritants and timed voiding - Managed by 0.4 mg daily  3. Erectile dysfunction - Used to take sildenafil, currently takes 20 mg of tadalafil prn  HPI: Ricky Avery is a 65 y.o. male who presents today for a yearly follow up.  HgA1c on 03/26/2020 was 6.6.  SHIM***    Score:  1-7 Mild 8-19 Moderate 20-35 Severe     PMH: Past Medical History:  Diagnosis Date  . Hypercholesteremia   . Hypertension   . Prostate cancer [facility])     Surgical History: Past Surgical History:  Procedure Laterality Date  . CARDIAC CATHETERIZATION      Home Medications:  Allergies as of 05/14/2020      Reactions   No Known Allergies       Medication List       Accurate as of May 13, 2020 11:14 AM. If you have any questions, ask your nurse or doctor.        aspirin 81 MG tablet Take 81 mg by mouth daily.   lisinopril 40 MG tablet Commonly known as: ZESTRIL Take 40 mg by mouth daily.   lisinopril 40 MG tablet Commonly known as: ZESTRIL TAKE 1 TABLET BY MOUTH ONCE DAILY   meloxicam 15 MG tablet Commonly known as: MOBIC   metoprolol succinate 25 MG 24 hr tablet Commonly known as: TOPROL-XL Take 25 mg by mouth daily.   metoprolol succinate 25 MG 24 hr tablet Commonly known as: TOPROL-XL TAKE 1 TABLET BY MOUTH ONCE DAILY   Potassium 99 MG  Tabs Take by mouth.   sildenafil 100 MG tablet Commonly known as: VIAGRA TAKE 1 TABLET BY MOUTH DAILY AS NEEDED FOR ERECTILE DYSFUNCTION   sildenafil 100 MG tablet Commonly known as: VIAGRA TAKE 1 TABLET BY MOUTH DAILY AS NEEDED FOR ERECTILE DYSFUNCTION   sildenafil 100 MG tablet Commonly known as: VIAGRA TAKE 1 TABLET BY MOUTH DAILY AS NEEDED FOR ERECTILE DYSFUNCTION   simvastatin 40 MG tablet Commonly known as: ZOCOR Take 40 mg by mouth daily.   simvastatin 40 MG tablet Commonly known as: ZOCOR TAKE 1 TABLET BY MOUTH NIGHTLY   tadalafil 20 MG tablet Commonly known as: CIALIS Take 1 tablet (20 mg total) by mouth daily as needed for erectile dysfunction.   tamsulosin 0.4 MG Caps capsule Commonly known as: FLOMAX Take 1 capsule (0.4 mg total) by mouth daily.   tamsulosin 0.4 MG Caps capsule Commonly known as: FLOMAX TAKE 1 CAPSULE BY MOUTH ONCE DAILY 30 MINUTES AFTER SAME MEAL EACH DAY.   Vitamin D (Ergocalciferol) 1.25 MG (50000 UNIT) Caps capsule Commonly known as: DRISDOL TAKE 1 CAPSULE BY MOUTH EVERY WEDNESDAY       Allergies: Allergies  Allergen Reactions  . No Known Allergies     Family History: Family History  Problem Relation Age of Onset  . Kidney cancer Neg Hx   . Prostate cancer  Neg Hx   . Bladder Cancer Neg Hx     Social History:   reports that he has quit smoking. He has never used smokeless tobacco. He reports current alcohol use. He reports that he does not use drugs.  ROS: Pertinent ROS in HPI.  Physical Exam: There were no vitals taken for this visit.  Constitutional:  Alert and oriented, No acute distress. HEENT: [place] AT, moist mucus membranes.  Trachea midline, no masses. Cardiovascular: No clubbing, cyanosis, or edema. Respiratory: Normal respiratory effort, no increased work of breathing. ***Rectal: Prostate is *** grams, no nodules, non-tender, with normal sphincter tone. Skin: No rashes, bruises or suspicious  lesions. Neurologic: Grossly intact, no focal deficits, moving all 4 extremities. Psychiatric: Normal mood and affect.  Laboratory Data:  Lab Results  Component Value Date   CREATININE 0.70 05/31/2017    Lab Results  Component Value Date   PSA 1.01 04/20/2016    I have reviewed the labs.  Assessment & Plan:      Follow Up:  No follow-ups on file.   I, Ardyth Gal, am acting as a Education administrator for [employer].    [place] [address], [place], [place] [address] [phone]

## 2020-05-13 NOTE — Progress Notes (Signed)
Error

## 2020-05-14 ENCOUNTER — Encounter: Payer: Self-pay | Admitting: Urology

## 2020-05-14 ENCOUNTER — Other Ambulatory Visit: Payer: Self-pay

## 2020-05-14 ENCOUNTER — Ambulatory Visit (INDEPENDENT_AMBULATORY_CARE_PROVIDER_SITE_OTHER): Payer: 59 | Admitting: Urology

## 2020-05-14 VITALS — BP 150/89 | HR 58 | Ht 60.0 in | Wt 161.0 lb

## 2020-05-14 DIAGNOSIS — N401 Enlarged prostate with lower urinary tract symptoms: Secondary | ICD-10-CM | POA: Diagnosis not present

## 2020-05-14 DIAGNOSIS — C61 Malignant neoplasm of prostate: Secondary | ICD-10-CM

## 2020-05-14 DIAGNOSIS — N138 Other obstructive and reflux uropathy: Secondary | ICD-10-CM

## 2020-05-14 DIAGNOSIS — N529 Male erectile dysfunction, unspecified: Secondary | ICD-10-CM | POA: Diagnosis not present

## 2020-05-14 NOTE — Progress Notes (Signed)
05/14/2020 8:51 AM   Ricky Avery Oct 03, 1955 [id number]  Referring provider: Barbette Reichmann, MD [address] [facility] [place],  [place] [address]  Chief Complaint  Patient presents with  . Prostate Cancer   Urological history: 1. Prostate cancer -PSA 0.2 in 04/2020 -diagnosed with stage I Gleason 3+4 prostate cancer involving 1 out of 6 core biopsies in 10/2013 . His presenting PSA was 3.7 -underwent treatment with external beam radiation -released from care 06/2019  2. LU TS (post-void dribbling) -I PSS 9/1 -managed with tamsulosin 0.4 mg daily   3. ED -contributing factors of age, pelvic radiation, BPH, prostate cancer, HTN and HLD   -SHIM 11  HPI: Ricky Avery is a 65 y.o. male who presents today for a yearly follow up. Today he was accompanied by a [place] interpreter, [place].   Today Mr. Ricky Avery reports that he is doing well. He stated that he was not experiencing any urinary symptoms during the day. However, reports nocturia 3-4x per night after 10 pm. He is drinking water or soda about an hour before he goes to sleep at 10 pm. He eats dinner around 8:30 once his wife gets home, so he eats his meal with soda or water. When he gets up at night to void he is able to fall back asleep shortly after.  He denies any dysuria, gross hematuria, or burning with voiding.   States that he is not having firm erections (5/10) when he takes the medication. Neither medication helps more than the other with his erections. He stated that he does not really care about it though, he just wants to be healthy. He denies pain or abnormal curvature with erections.   Patient is still taking Flomax, does not need refill currently.    IPSS    Row Name 05/14/20 0800         International Prostate Symptom Score   How often have you had the sensation of not emptying your bladder? Not at All     How often have you had to urinate less than every two hours? Almost always      How often have you found you stopped and started again several times when you urinated? Not at All     How often have you found it difficult to postpone urination? Not at All     How often have you had a weak urinary stream? Not at All     How often have you had to strain to start urination? Not at All     How many times did you typically get up at night to urinate? 4 Times     Total IPSS Score 9           Quality of Life due to urinary symptoms   If you were to spend the rest of your life with your urinary condition just the way it is now how would you feel about that? Pleased            Score:  1-7 Mild 8-19 Moderate 20-35 Severe     SHIM    Row Name 05/14/20 0837         SHIM: Over the last 6 months:   How do you rate your confidence that you could get and keep an erection? Low     When you had erections with sexual stimulation, how often were your erections hard enough for penetration (entering your partner)? Most Times (much more than half the time)  During sexual intercourse, how often were you able to maintain your erection after you had penetrated (entered) your partner? A Few Times (much less than half the time)     During sexual intercourse, how difficult was it to maintain your erection to completion of intercourse? Very Difficult     When you attempted sexual intercourse, how often was it satisfactory for you? Almost Never or Never           SHIM Total Score   SHIM 11            Score: 1-7 Severe ED 8-11 Moderate ED 12-16 Mild-Moderate ED 17-21 Mild ED 22-25 No ED   PMH: Past Medical History:  Diagnosis Date  . Hypercholesteremia   . Hypertension   . Prostate cancer [facility])     Surgical History: Past Surgical History:  Procedure Laterality Date  . CARDIAC CATHETERIZATION      Home Medications:  Allergies as of 05/14/2020      Reactions   No Known Allergies       Medication List       Accurate as of May 14, 2020  8:51 AM. If you  have any questions, ask your nurse or doctor.        aspirin 81 MG tablet Take 81 mg by mouth daily.   lisinopril 40 MG tablet Commonly known as: ZESTRIL Take 40 mg by mouth daily. What changed: Another medication with the same name was removed. Continue taking this medication, and follow the directions you see here. Changed by: Zara Council, PA-C   meloxicam 15 MG tablet Commonly known as: MOBIC   metoprolol succinate 25 MG 24 hr tablet Commonly known as: TOPROL-XL Take 25 mg by mouth daily. What changed: Another medication with the same name was removed. Continue taking this medication, and follow the directions you see here. Changed by: Zara Council, PA-C   Potassium 99 MG Tabs Take by mouth.   sildenafil 100 MG tablet Commonly known as: VIAGRA TAKE 1 TABLET BY MOUTH DAILY AS NEEDED FOR ERECTILE DYSFUNCTION What changed: Another medication with the same name was removed. Continue taking this medication, and follow the directions you see here. Changed by: Zara Council, PA-C   simvastatin 40 MG tablet Commonly known as: ZOCOR Take 40 mg by mouth daily. What changed: Another medication with the same name was removed. Continue taking this medication, and follow the directions you see here. Changed by: Zara Council, PA-C   tadalafil 20 MG tablet Commonly known as: CIALIS Take 1 tablet (20 mg total) by mouth daily as needed for erectile dysfunction.   tamsulosin 0.4 MG Caps capsule Commonly known as: FLOMAX Take 1 capsule (0.4 mg total) by mouth daily. What changed: Another medication with the same name was removed. Continue taking this medication, and follow the directions you see here. Changed by: Zara Council, PA-C   Vitamin D (Ergocalciferol) 1.25 MG (50000 UNIT) Caps capsule Commonly known as: DRISDOL TAKE 1 CAPSULE BY MOUTH EVERY WEDNESDAY       Allergies:  Allergies  Allergen Reactions  . No Known Allergies     Family History: Family  History  Problem Relation Age of Onset  . Kidney cancer Neg Hx   . Prostate cancer Neg Hx   . Bladder Cancer Neg Hx     Social History:  reports that he has quit smoking. He has never used smokeless tobacco. He reports current alcohol use. He reports that he does not use drugs.  ROS: Pertinent  ROS in HPI  Physical Exam: BP (!) 150/89   Pulse (!) 58   Ht 5' (1.524 m)   Wt 161 lb (73 kg)   BMI 31.44 kg/m   Constitutional:  Well nourished. Alert and oriented, No acute distress. HEENT: [place] AT, mask in place.  Trachea midline Cardiovascular: No clubbing, cyanosis, or edema. Respiratory: Normal respiratory effort, no increased work of breathing. GU: No CVA tenderness.  No bladder fullness or masses.  Patient with circumcised/uncircumcised phallus. Foreskin easily retracted. Urethral meatus is patent.  No penile discharge. No penile lesions or rashes. Scrotum without lesions, cysts, rashes and/or edema.  Testicles are located scrotally bilaterally. No masses are appreciated in the testicles. Left and right epididymis are normal. Rectal: Patient with  normal sphincter tone. Anus and perineum without scarring or rashes. No rectal masses are appreciated. Prostate is small and fibrous, no nodules are appreciated. Seminal vesicles could not be palpated Lymph: No inguinal adenopathy. Neurologic: Grossly intact, no focal deficits, moving all 4 extremities. Psychiatric: Normal mood and affect.   Laboratory Data: Component     Latest Ref Rng & Units 02/14/2016 08/08/2016 01/26/2017 04/30/2017  Prostate Specific Ag, Serum     0.0 - 4.0 ng/mL 1.2 1.2 0.8 0.9   Component     Latest Ref Rng & Units 11/07/2017 05/13/2018 05/06/2019 05/10/2020  Prostate Specific Ag, Serum     0.0 - 4.0 ng/mL 0.7 0.5 0.2 0.2   Ref Range & Units 1 mo ago   WBC (White Blood Cell Count) 4.1 - 10.2 10^3/uL 5.2   RBC (Red Blood Cell Count) 4.69 - 6.13 10^6/uL 4.90   Hemoglobin 14.1 - 18.1 gm/dL 15.6   Hematocrit 40.0 - 52.0 %  45.8   MCV (Mean Corpuscular Volume) 80.0 - 100.0 fl 93.5   MCH (Mean Corpuscular Hemoglobin) 27.0 - 31.2 pg 31.8High   MCHC (Mean Corpuscular Hemoglobin Concentration) 32.0 - 36.0 gm/dL 34.1   Platelet Count 150 - 450 10^3/uL 156   RDW-CV (Red Cell Distribution Width) 11.6 - 14.8 % 12.7   MPV (Mean Platelet Volume) 9.4 - 12.4 fl 11.7   Neutrophils 1.50 - 7.80 10^3/uL 2.60   Lymphocytes 1.00 - 3.60 10^3/uL 1.70   Monocytes 0.00 - 1.50 10^3/uL 0.56   Eosinophils 0.00 - 0.55 10^3/uL 0.28   Basophils 0.00 - 0.09 10^3/uL 0.05   Neutrophil % 32.0 - 70.0 % 49.9   Lymphocyte % 10.0 - 50.0 % 32.7   Monocyte % 4.0 - 13.0 % 10.8   Eosinophil % 1.0 - 5.0 % 5.4High   Basophil% 0.0 - 2.0 % 1.0   Immature Granulocyte % <=0.7 % 0.2   Immature Granulocyte Count <=0.06 10^3/L 0.01   Resulting Agency  [facility] - [facility]  Specimen Collected: 03/26/20 07:12 Last Resulted: 03/26/20 07:53  Received From: [facility]  Result Received: 04/27/20 09:43    Ref Range & Units 1 mo ago  Thyroid Stimulating Hormone (TSH) 0.450-5.330 uIU/ml uIU/mL 1.905   Resulting Agency  [facility] - LAB  Specimen Collected: 03/26/20 07:12 Last Resulted: 03/26/20 10:14  Received From: [facility]  Result Received: 04/27/20 09:43   Ref Range & Units 1 mo ago   Hemoglobin A1C 4.2 - 5.6 % 6.6High   Average Blood Glucose (Calc) mg/dL 143   Resulting Agency  [facility] - LAB   Narrative Performed by [facility] - [facility] Normal Range:  4.2 - 5.6%  Increased Risk: 5.7 -  6.4%  Diabetes:    >= 6.5%  Glycemic Control for adults with diabetes: <7%  Specimen Collected: 03/26/20 07:12 Last Resulted: 03/26/20 09:04  Received From: [facility]  Result Received: 04/27/20 09:43   Ref Range & Units 1 mo ago   Color Yellow, Violet, Light Violet, Dark Violet Yellow   Clarity Clear Clear   Specific Gravity 1.000 - 1.030 1.025   pH,  Urine 5.0 - 8.0 6.0   Protein, Urinalysis Negative, Trace mg/dL Negative   Glucose, Urinalysis Negative mg/dL Negative   Ketones, Urinalysis Negative mg/dL Negative   Blood, Urinalysis Negative Negative   Nitrite, Urinalysis Negative Negative   Leukocyte Esterase, Urinalysis Negative Negative   White Blood Cells, Urinalysis None Seen, 0-3 /hpf 0-3   Red Blood Cells, Urinalysis None Seen, 0-3 /hpf 0-3   Bacteria, Urinalysis None Seen /hpf None Seen   Squamous Epithelial Cells, Urinalysis Rare, Few, None Seen /hpf None Seen   Resulting Agency  [facility] - LAB  Specimen Collected: 03/26/20 07:12 Last Resulted: 03/26/20 08:20  Received From: [facility]  Result Received: 04/27/20 09:43   Ref Range & Units 1 mo ago   Cholesterol, Total 100 - 200 mg/dL 128   Triglyceride 35 - 199 mg/dL 77   HDL (High Density Lipoprotein) Cholesterol 29.0 - 71.0 mg/dL 35.0   LDL Calculated 0 - 130 mg/dL 78   VLDL Cholesterol mg/dL 15   Cholesterol/HDL Ratio  3.7   Resulting Agency  [facility] - LAB  Specimen Collected: 03/26/20 07:12 Last Resulted: 03/26/20 10:28  Received From: [facility]  Result Received: 04/27/20 09:43   Ref Range & Units 1 mo ago   Glucose 70 - 110 mg/dL 130High   Sodium 136 - 145 mmol/L 142   Potassium 3.6 - 5.1 mmol/L 4.3   Chloride 97 - 109 mmol/L 104   Carbon Dioxide (CO2) 22.0 - 32.0 mmol/L 29.8   Urea Nitrogen (BUN) 7 - 25 mg/dL 15   Creatinine 0.7 - 1.3 mg/dL 0.7   Glomerular Filtration Rate (eGFR), MDRD Estimate >60 mL/min/1.73sq m 114   Calcium 8.7 - 10.3 mg/dL 9.3   AST  8 - 39 U/L 26   ALT  6 - 57 U/L 29   Alk Phos (alkaline Phosphatase) 34 - 104 U/L 77   Albumin 3.5 - 4.8 g/dL 4.5   Bilirubin, Total 0.3 - 1.2 mg/dL 0.8   Protein, Total 6.1 - 7.9 g/dL 6.7   A/G Ratio 1.0 - 5.0 gm/dL 2.0   Resulting Agency  [facility] - LAB  Specimen Collected: 03/26/20 07:12 Last Resulted: 03/26/20 10:28   Received From: [facility]  Result Received: 04/27/20 09:43  I have reviewed the labs.   Pertinent Imaging: No recent imaging  Assessment & Plan:     1. Prostate cancer -PSA stable  2. BPH with LUTS Has nocturia x3 not bothersome, continue tamsulosin IPSS score is 3/1, it is improving Continue conservative management, avoiding bladder irritants and timed voiding's Continue tamsulosin 0.4 mg daily  3. Erectile dysfunction PD5 inhibitors are no longer effective, but does not want any other treatments at this time.  Follow Up:  - RTC in 6 months for DRE, and PSA, prefers 10 am.  These notes generated with voice recognition software. I apologize for typographical errors.  Ardyth Gal   I, Ardyth Gal, am acting as a Education administrator for [employer].   [place]  [address]  [place], [place] [address] [phone]  I have reviewed the above documentation for accuracy and completeness, and I agree with the above.    Zara Council, PA-C

## 2020-05-26 DIAGNOSIS — I2510 Atherosclerotic heart disease of native coronary artery without angina pectoris: Secondary | ICD-10-CM | POA: Diagnosis not present

## 2020-05-26 DIAGNOSIS — I214 Non-ST elevation (NSTEMI) myocardial infarction: Secondary | ICD-10-CM | POA: Diagnosis not present

## 2020-05-26 DIAGNOSIS — I10 Essential (primary) hypertension: Secondary | ICD-10-CM | POA: Diagnosis not present

## 2020-05-26 DIAGNOSIS — E7800 Pure hypercholesterolemia, unspecified: Secondary | ICD-10-CM | POA: Diagnosis not present

## 2020-06-08 ENCOUNTER — Other Ambulatory Visit: Payer: Self-pay

## 2020-06-08 ENCOUNTER — Ambulatory Visit: Payer: 59 | Attending: Internal Medicine

## 2020-06-08 DIAGNOSIS — Z23 Encounter for immunization: Secondary | ICD-10-CM

## 2020-06-08 MED ORDER — PFIZER-BIONT COVID-19 VAC-TRIS 30 MCG/0.3ML IM SUSP
INTRAMUSCULAR | 0 refills | Status: DC
  Filled 2020-06-08: qty 0.3, 20d supply, fill #0

## 2020-06-08 NOTE — Progress Notes (Signed)
   Covid-19 Vaccination Clinic  Name:  Ricky Avery    MRN: [id number] DOB: [birth year]  06/08/2020  Mr. Ricky Avery was observed post Covid-19 immunization for 15 minutes without incident. He was provided with Vaccine Information Sheet and instruction to access the V-Safe system.   Mr. Ricky Avery was instructed to call 911 with any severe reactions post vaccine: [name] Difficulty breathing  . Swelling of face and throat  . A fast heartbeat  . A bad rash all over body  . Dizziness and weakness   Immunizations Administered    Name Date Dose VIS Date Route   PFIZER Comrnaty(Gray TOP) Covid-19 Vaccine 06/08/2020  2:06 PM 0.3 mL 01/01/2020 Intramuscular   Manufacturer: [employer]   Lot: [id number]   NDC: [place], PharmD, MBA Clinical Acute Care Pharmacist

## 2020-06-17 DIAGNOSIS — R1032 Left lower quadrant pain: Secondary | ICD-10-CM | POA: Diagnosis not present

## 2020-06-17 DIAGNOSIS — R1031 Right lower quadrant pain: Secondary | ICD-10-CM | POA: Diagnosis not present

## 2020-06-17 DIAGNOSIS — R109 Unspecified abdominal pain: Secondary | ICD-10-CM | POA: Diagnosis not present

## 2020-06-19 DIAGNOSIS — B029 Zoster without complications: Secondary | ICD-10-CM | POA: Diagnosis not present

## 2020-06-22 ENCOUNTER — Other Ambulatory Visit: Payer: Self-pay

## 2020-06-22 MED ORDER — TRAMADOL HCL 50 MG PO TABS
ORAL_TABLET | ORAL | 0 refills | Status: DC
  Filled 2020-06-22: qty 20, 5d supply, fill #0

## 2020-06-22 MED ORDER — VALACYCLOVIR HCL 1 G PO TABS
ORAL_TABLET | ORAL | 0 refills | Status: DC
  Filled 2020-06-22: qty 21, 7d supply, fill #0

## 2020-06-23 ENCOUNTER — Other Ambulatory Visit: Payer: Self-pay

## 2020-06-23 DIAGNOSIS — B029 Zoster without complications: Secondary | ICD-10-CM | POA: Diagnosis not present

## 2020-06-23 DIAGNOSIS — B028 Zoster with other complications: Secondary | ICD-10-CM | POA: Diagnosis not present

## 2020-06-23 MED ORDER — VALACYCLOVIR HCL 1 G PO TABS
ORAL_TABLET | ORAL | 0 refills | Status: DC
  Filled 2020-06-23: qty 21, 7d supply, fill #0

## 2020-06-23 MED ORDER — GABAPENTIN 100 MG PO CAPS
ORAL_CAPSULE | ORAL | 1 refills | Status: DC
  Filled 2020-06-23: qty 90, 30d supply, fill #0

## 2020-06-25 ENCOUNTER — Other Ambulatory Visit: Payer: Self-pay

## 2020-07-04 ENCOUNTER — Other Ambulatory Visit: Payer: Self-pay

## 2020-07-04 ENCOUNTER — Emergency Department
Admission: EM | Admit: 2020-07-04 | Discharge: 2020-07-04 | Disposition: A | Discharge status: Home / Self Care | Payer: 59 | Attending: Emergency Medicine | Admitting: Emergency Medicine

## 2020-07-04 DIAGNOSIS — Z79899 Other long term (current) drug therapy: Secondary | ICD-10-CM | POA: Diagnosis not present

## 2020-07-04 DIAGNOSIS — I10 Essential (primary) hypertension: Secondary | ICD-10-CM | POA: Diagnosis not present

## 2020-07-04 DIAGNOSIS — Z87891 Personal history of nicotine dependence: Secondary | ICD-10-CM | POA: Diagnosis not present

## 2020-07-04 DIAGNOSIS — B029 Zoster without complications: Secondary | ICD-10-CM | POA: Diagnosis not present

## 2020-07-04 DIAGNOSIS — Z7982 Long term (current) use of aspirin: Secondary | ICD-10-CM | POA: Insufficient documentation

## 2020-07-04 DIAGNOSIS — Z8546 Personal history of malignant neoplasm of prostate: Secondary | ICD-10-CM | POA: Diagnosis not present

## 2020-07-04 DIAGNOSIS — R21 Rash and other nonspecific skin eruption: Secondary | ICD-10-CM | POA: Diagnosis present

## 2020-07-04 MED ORDER — VALACYCLOVIR HCL 1 G PO TABS
1000.0000 mg | ORAL_TABLET | Freq: Three times a day (TID) | ORAL | 0 refills | Status: AC
  Filled 2020-07-04: qty 21, 7d supply, fill #0

## 2020-07-04 MED ORDER — OXYCODONE-ACETAMINOPHEN 5-325 MG PO TABS
1.0000 | ORAL_TABLET | Freq: Once | ORAL | Status: AC
  Administered 2020-07-04: 1 via ORAL
  Filled 2020-07-04: qty 1

## 2020-07-04 MED ORDER — PREDNISONE 20 MG PO TABS
60.0000 mg | ORAL_TABLET | Freq: Once | ORAL | Status: AC
  Administered 2020-07-04: 60 mg via ORAL
  Filled 2020-07-04: qty 3

## 2020-07-04 MED ORDER — PREDNISONE 10 MG PO TABS
10.0000 mg | ORAL_TABLET | ORAL | 0 refills | Status: DC
  Filled 2020-07-04: qty 42, 12d supply, fill #0

## 2020-07-04 MED ORDER — LIDOCAINE HCL 3 % EX CREA
1.0000 "application " | TOPICAL_CREAM | Freq: Two times a day (BID) | CUTANEOUS | 1 refills | Status: DC | PRN
  Filled 2020-07-04: qty 30, fill #0
  Filled 2020-07-05: qty 28.3, 8d supply, fill #0

## 2020-07-04 MED ORDER — HYDROCODONE-ACETAMINOPHEN 5-325 MG PO TABS
1.0000 | ORAL_TABLET | ORAL | 0 refills | Status: AC | PRN
  Filled 2020-07-04: qty 20, 3d supply, fill #0

## 2020-07-04 NOTE — ED Notes (Signed)
URINE SAMPLE SENT TO LAB IF NEEDED

## 2020-07-04 NOTE — ED Triage Notes (Signed)
Information obtained with assist of sp[place] interpreter ruben # [id number]. Pt states had shingles a week ago along left flank extending to back and llq . Pt states area is itching, burning and painful. Pt states rash has dried. Pt states pain only lasts a few seconds at a time, but is not tolerable when it comes on. No fever, no vomiting.

## 2020-07-04 NOTE — ED Provider Notes (Signed)
[facility] Emergency Department Provider Note  ____________________________________________  Time seen: Approximately 11:01 PM  I have reviewed the triage vital signs and the nursing notes.   HISTORY  Chief Complaint Abdominal Pain    HPI Ricky Avery is a 65 y.o. male who presents the emergency department complaining of a rash along his left flank leading into his left abdomen concerning for shingles.  Patient states that he has had significant pain to the area.  No abdominal pain.  No nausea, vomiting, diarrhea or constipation.  Patient has tried over-the-counter medications with no improvement of symptoms.  No other complaints at this time.       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 Authorizing Provider  HYDROcodone-acetaminophen (NORCO/VICODIN) 5-325 MG tablet Take 1 tablet by mouth every 4 (four) hours as needed for moderate pain. 07/04/20 07/04/21 Yes Haadi Santellan, Charline Bills, PA-C  Lidocaine 3 % CREA Apply 1 application topically 2 (two) times daily as needed (pain). 07/04/20  Yes  Sriram Febles, Charline Bills, PA-C  predniSONE (DELTASONE) 10 MG tablet Take 1 tablet (10 mg total) by mouth as directed. 07/04/20  Yes Mataio Mele, Charline Bills, PA-C  valACYclovir (VALTREX) 1000 MG tablet Take 1 tablet (1,000 mg total) by mouth 3 (three) times daily for 7 days. 07/04/20 07/11/20 Yes Jazzma Neidhardt, Charline Bills, PA-C  aspirin 81 MG tablet Take 81 mg by mouth daily.    [provider]  COVID-19 mRNA Vac-TriS, Pfizer, (PFIZER-BIONT COVID-19 VAC-TRIS) SUSP injection Inject into the muscle. 06/08/20   Carlyle Basques, MD  gabapentin (NEURONTIN) 100 MG capsule Take 1 capsule (100 mg total) by mouth 3 (three) times daily for 90 days For pain from shingles 06/23/20     lisinopril (ZESTRIL) 40 MG tablet Take 40 mg by mouth daily.    [provider]  meloxicam (MOBIC) 15 MG tablet  06/11/18   [provider]  metoprolol succinate (TOPROL-XL) 25 MG 24 hr tablet Take 25 mg by mouth daily.    [provider]  Potassium 99 MG TABS Take by mouth.    [provider]  sildenafil (VIAGRA) 100 MG tablet TAKE 1 TABLET BY MOUTH DAILY AS NEEDED FOR ERECTILE DYSFUNCTION 06/25/19 06/24/20  Hollice Espy, MD  simvastatin (ZOCOR) 40 MG tablet Take 40 mg by mouth daily.    [provider]  tadalafil (CIALIS) 20 MG tablet Take 1 tablet (20 mg total) by mouth daily as needed for erectile dysfunction. 05/15/19   Zara Council A, PA-C  tamsulosin (FLOMAX) 0.4 MG CAPS capsule Take 1 capsule (0.4 mg total) by mouth daily. 11/14/17   Zara Council  A, PA-C  traMADol (ULTRAM) 50 MG tablet Take 1 tablet (50 mg total) by mouth every 6 (six) hours as needed for up to 10 days 06/19/20     Vitamin D, Ergocalciferol, (DRISDOL) 1.25 MG (50000 UNIT) CAPS capsule TAKE 1 CAPSULE BY MOUTH EVERY [facility] 09/19/19 09/18/20  Tracie Harrier, MD    Allergies No known allergies  Family History  Problem Relation Age of Onset   Kidney cancer Neg Hx    Prostate cancer Neg Hx    Bladder Cancer  Neg Hx     Social History Social History   Tobacco Use   Smoking status: Former    Pack years: 0.00   Smokeless tobacco: Never   Tobacco comments:    quit 2007  Substance Use Topics   Alcohol use: Yes    Alcohol/week: 0.0 standard drinks    Comment: socially   Drug use: No     Review of Systems  Constitutional: No fever/chills Eyes: No visual changes. No discharge ENT: No upper respiratory complaints. Cardiovascular: no chest pain. Respiratory: no cough. No SOB. Gastrointestinal: No abdominal pain.  No nausea, no vomiting.  No diarrhea.  No constipation. Musculoskeletal: Negative for musculoskeletal pain. Skin: Positive for rash in dermatomal distribution along the left flank Neurological: Negative for headaches, focal weakness or numbness.  10 System ROS otherwise negative.  ____________________________________________   PHYSICAL EXAM:  VITAL SIGNS: ED Triage Vitals  Enc Vitals Group     BP 07/04/20 2108 (!) 191/103     Pulse Rate 07/04/20 2108 77     Resp 07/04/20 2108 16     Temp 07/04/20 2108 98.1 F (36.7 C)     Temp Source 07/04/20 2108 Oral     SpO2 07/04/20 2108 100 %     Weight 07/04/20 2110 157 lb (71.2 kg)     Height 07/04/20 2109 5\' 5"  (1.651 m)     Head Circumference --      Peak Flow --      Pain Score 07/04/20 2108 9     Pain Loc --      Pain Edu? --      Excl. in [place]? --      Constitutional: Alert and oriented. Well appearing and in no acute distress. Eyes: Conjunctivae are normal. PERRL. EOMI. Head: Atraumatic. ENT:      Ears:       Nose: No congestion/rhinnorhea.      Mouth/Throat: Mucous membranes are moist.  Neck: No stridor.    Cardiovascular: Normal rate, regular rhythm. Normal S1 and S2.  Good peripheral circulation. Respiratory: Normal respiratory effort without tachypnea or retractions. Lungs CTAB. Good air entry to the bases with no decreased or absent breath sounds. Musculoskeletal: Full range of motion to all extremities.  No gross deformities appreciated. Neurologic:  Normal speech and language. No gross focal neurologic deficits are appreciated.  Skin:  Skin is warm, dry and intact.  Positive for vesicular and scabbed rash in dermatomal distribution along the left flank.  No surrounding findings concerning for cellulitis. Psychiatric: Mood and affect are normal. Speech and behavior are normal. Patient exhibits appropriate insight and judgement.   ____________________________________________   LABS (all labs ordered are listed, but only abnormal results are displayed)  Labs Reviewed - No data to display ____________________________________________  EKG   ____________________________________________  RADIOLOGY  No results found.  ____________________________________________    PROCEDURES  Procedure(s) performed:    Procedures    Medications  oxyCODONE-acetaminophen (PERCOCET/ROXICET) 5-325 MG per tablet  1 tablet (has no administration in time range)  predniSONE (DELTASONE) tablet 60 mg (has no administration in time range)     ____________________________________________   INITIAL IMPRESSION / ASSESSMENT AND PLAN / ED COURSE  Pertinent labs & imaging results that were available during my care of the patient were reviewed by me and considered in my medical decision making (see chart for details).  Review of the [place] CSRS was performed in accordance of the [place] prior to dispensing any controlled drugs.           Patient's diagnosis is consistent with shingles.  Patient presented to emergency department complaining of rash and pain in dermatomal distribution on the left side.  Findings are consistent with shingles.  Patient was started on Valtrex, Vicodin, prednisone and lidocaine.  Follow-up primary care as needed if symptoms are not adequately improving or he has postherpetic pain..  No labs or imaging currently.  Return precautions discussed with the patient.  Patient is given ED  precautions to return to the ED for any worsening or new symptoms.     ____________________________________________  FINAL CLINICAL IMPRESSION(S) / ED DIAGNOSES  Final diagnoses:  Herpes zoster without complication      NEW MEDICATIONS STARTED DURING THIS VISIT:  ED Discharge Orders          Ordered    valACYclovir (VALTREX) 1000 MG tablet  3 times daily        07/04/20 2301    HYDROcodone-acetaminophen (NORCO/VICODIN) 5-325 MG tablet  Every 4 hours PRN        07/04/20 2301    predniSONE (DELTASONE) 10 MG tablet  As directed       Note to Pharmacy: Take on a pattern of 6, 6, 5, 5, 4, 4, 3, 3, 2, 2, 1, 1   07/04/20 2301    Lidocaine 3 % CREA  2 times daily PRN        07/04/20 2301                This chart was dictated using voice recognition software/Dragon. Despite best efforts to proofread, errors can occur which can change the meaning. Any change was purely unintentional.    Darletta Moll, PA-C 07/04/20 2304    Carrie Mew, MD 07/04/20 2358

## 2020-07-05 ENCOUNTER — Other Ambulatory Visit: Payer: Self-pay

## 2020-07-06 ENCOUNTER — Other Ambulatory Visit: Payer: Self-pay

## 2020-07-07 ENCOUNTER — Other Ambulatory Visit: Payer: Self-pay

## 2020-07-07 DIAGNOSIS — I10 Essential (primary) hypertension: Secondary | ICD-10-CM | POA: Diagnosis not present

## 2020-07-07 DIAGNOSIS — M792 Neuralgia and neuritis, unspecified: Secondary | ICD-10-CM | POA: Diagnosis not present

## 2020-07-07 DIAGNOSIS — C61 Malignant neoplasm of prostate: Secondary | ICD-10-CM | POA: Diagnosis not present

## 2020-07-07 DIAGNOSIS — B029 Zoster without complications: Secondary | ICD-10-CM | POA: Diagnosis not present

## 2020-07-07 MED ORDER — DOCUSATE SODIUM 100 MG PO CAPS: ORAL_CAPSULE | ORAL | 2 refills | Status: DC

## 2020-07-14 ENCOUNTER — Other Ambulatory Visit: Payer: Self-pay

## 2020-07-14 MED ORDER — SIMVASTATIN 40 MG PO TABS
ORAL_TABLET | Freq: Every day | ORAL | 1 refills | Status: DC
  Filled 2020-07-14: qty 90, 90d supply, fill #0
  Filled 2020-10-14: qty 90, 90d supply, fill #1

## 2020-07-14 MED ORDER — LISINOPRIL 40 MG PO TABS
ORAL_TABLET | Freq: Every day | ORAL | 1 refills | Status: DC
  Filled 2020-07-14: qty 90, 90d supply, fill #0
  Filled 2020-10-14: qty 90, 90d supply, fill #1

## 2020-07-14 MED ORDER — METOPROLOL SUCCINATE ER 25 MG PO TB24
ORAL_TABLET | Freq: Every day | ORAL | 1 refills | Status: DC
  Filled 2020-07-14: qty 90, 90d supply, fill #0
  Filled 2020-10-14: qty 90, 90d supply, fill #1

## 2020-07-14 MED ORDER — TAMSULOSIN HCL 0.4 MG PO CAPS
ORAL_CAPSULE | ORAL | 1 refills | Status: AC
  Filled 2020-07-14: qty 90, 90d supply, fill #0

## 2020-07-20 ENCOUNTER — Other Ambulatory Visit: Payer: Self-pay

## 2020-07-20 DIAGNOSIS — B0229 Other postherpetic nervous system involvement: Secondary | ICD-10-CM | POA: Diagnosis not present

## 2020-07-20 DIAGNOSIS — I10 Essential (primary) hypertension: Secondary | ICD-10-CM | POA: Diagnosis not present

## 2020-07-20 MED ORDER — GABAPENTIN 300 MG PO CAPS
300.0000 mg | ORAL_CAPSULE | Freq: Three times a day (TID) | ORAL | 11 refills | Status: DC
  Filled 2020-07-20: qty 90, 30d supply, fill #0

## 2020-07-20 MED ORDER — LIDOCAINE 3 % EX CREA: TOPICAL_CREAM | CUTANEOUS | 1 refills | Status: DC

## 2020-07-21 ENCOUNTER — Other Ambulatory Visit: Payer: Self-pay

## 2020-07-27 ENCOUNTER — Other Ambulatory Visit: Payer: Self-pay

## 2020-07-27 DIAGNOSIS — I10 Essential (primary) hypertension: Secondary | ICD-10-CM | POA: Diagnosis not present

## 2020-07-27 DIAGNOSIS — B0229 Other postherpetic nervous system involvement: Secondary | ICD-10-CM | POA: Diagnosis not present

## 2020-07-27 MED ORDER — GABAPENTIN 600 MG PO TABS
ORAL_TABLET | ORAL | 1 refills | Status: DC
  Filled 2020-07-27: qty 90, 30d supply, fill #0

## 2020-07-27 MED ORDER — LIDOCAINE HCL 3 % EX CREA
TOPICAL_CREAM | CUTANEOUS | 1 refills | Status: DC
  Filled 2020-07-27: qty 84.9, 15d supply, fill #0

## 2020-07-30 DIAGNOSIS — B0229 Other postherpetic nervous system involvement: Secondary | ICD-10-CM | POA: Diagnosis not present

## 2020-07-30 DIAGNOSIS — R21 Rash and other nonspecific skin eruption: Secondary | ICD-10-CM | POA: Diagnosis not present

## 2020-08-18 ENCOUNTER — Other Ambulatory Visit: Payer: Self-pay

## 2020-08-18 DIAGNOSIS — R634 Abnormal weight loss: Secondary | ICD-10-CM | POA: Diagnosis not present

## 2020-08-18 DIAGNOSIS — B0229 Other postherpetic nervous system involvement: Secondary | ICD-10-CM | POA: Diagnosis not present

## 2020-08-18 DIAGNOSIS — I10 Essential (primary) hypertension: Secondary | ICD-10-CM | POA: Diagnosis not present

## 2020-08-18 DIAGNOSIS — R739 Hyperglycemia, unspecified: Secondary | ICD-10-CM | POA: Diagnosis not present

## 2020-08-18 DIAGNOSIS — E1165 Type 2 diabetes mellitus with hyperglycemia: Secondary | ICD-10-CM | POA: Diagnosis not present

## 2020-08-18 DIAGNOSIS — C61 Malignant neoplasm of prostate: Secondary | ICD-10-CM | POA: Diagnosis not present

## 2020-08-18 MED ORDER — OXYCODONE-ACETAMINOPHEN 5-325 MG PO TABS
ORAL_TABLET | ORAL | 0 refills | Status: DC
  Filled 2020-08-18: qty 60, 30d supply, fill #0

## 2020-08-18 MED ORDER — LIDOCAINE HCL 3 % EX CREA
1.0000 "application " | TOPICAL_CREAM | Freq: Two times a day (BID) | CUTANEOUS | 3 refills | Status: DC | PRN
  Filled 2020-08-19: qty 28.35, 10d supply, fill #0

## 2020-08-19 ENCOUNTER — Other Ambulatory Visit: Payer: Self-pay

## 2020-08-20 ENCOUNTER — Other Ambulatory Visit: Payer: Self-pay

## 2020-09-22 ENCOUNTER — Other Ambulatory Visit: Payer: Self-pay

## 2020-09-22 DIAGNOSIS — R739 Hyperglycemia, unspecified: Secondary | ICD-10-CM | POA: Diagnosis not present

## 2020-09-22 DIAGNOSIS — E1165 Type 2 diabetes mellitus with hyperglycemia: Secondary | ICD-10-CM | POA: Diagnosis not present

## 2020-09-22 DIAGNOSIS — Z0000 Encounter for general adult medical examination without abnormal findings: Secondary | ICD-10-CM | POA: Diagnosis not present

## 2020-09-22 DIAGNOSIS — B0229 Other postherpetic nervous system involvement: Secondary | ICD-10-CM | POA: Diagnosis not present

## 2020-09-22 MED ORDER — LIDOCAINE HCL 3 % EX CREA
1.0000 "application " | TOPICAL_CREAM | Freq: Two times a day (BID) | CUTANEOUS | 3 refills | Status: DC
  Filled 2020-09-22: qty 85, 14d supply, fill #0
  Filled 2020-11-11: qty 85, 14d supply, fill #1

## 2020-09-22 MED ORDER — GLIMEPIRIDE 1 MG PO TABS
ORAL_TABLET | ORAL | 3 refills | Status: DC
  Filled 2020-09-22: qty 90, 90d supply, fill #0

## 2020-09-22 MED ORDER — METFORMIN HCL 500 MG PO TABS
500.0000 mg | ORAL_TABLET | Freq: Two times a day (BID) | ORAL | 3 refills | Status: DC
  Filled 2020-09-22: qty 180, 90d supply, fill #0
  Filled 2021-02-17: qty 180, 90d supply, fill #1
  Filled 2021-05-18: qty 180, 90d supply, fill #2

## 2020-09-22 MED ORDER — GABAPENTIN 600 MG PO TABS
ORAL_TABLET | ORAL | 3 refills | Status: DC
  Filled 2020-09-22: qty 90, 90d supply, fill #0

## 2020-09-22 MED ORDER — OXYCODONE-ACETAMINOPHEN 5-325 MG PO TABS
ORAL_TABLET | ORAL | 0 refills | Status: DC
  Filled 2020-09-22: qty 60, 30d supply, fill #0

## 2020-09-23 ENCOUNTER — Other Ambulatory Visit: Payer: Self-pay

## 2020-10-14 ENCOUNTER — Other Ambulatory Visit: Payer: Self-pay

## 2020-10-19 ENCOUNTER — Other Ambulatory Visit: Payer: Self-pay

## 2020-10-19 DIAGNOSIS — B0229 Other postherpetic nervous system involvement: Secondary | ICD-10-CM | POA: Diagnosis not present

## 2020-10-20 ENCOUNTER — Other Ambulatory Visit: Payer: Self-pay

## 2020-10-21 ENCOUNTER — Other Ambulatory Visit: Payer: Self-pay

## 2020-10-22 ENCOUNTER — Other Ambulatory Visit: Payer: Self-pay

## 2020-10-27 ENCOUNTER — Other Ambulatory Visit: Payer: Self-pay

## 2020-10-28 ENCOUNTER — Other Ambulatory Visit: Payer: Self-pay

## 2020-10-28 MED ORDER — PREGABALIN 50 MG PO CAPS
ORAL_CAPSULE | ORAL | 1 refills | Status: DC
  Filled 2020-10-28: qty 90, 30d supply, fill #0

## 2020-10-28 MED ORDER — PREGABALIN 25 MG PO CAPS
ORAL_CAPSULE | ORAL | 0 refills | Status: DC
  Filled 2020-10-28: qty 21, 7d supply, fill #0

## 2020-10-28 MED ORDER — GABAPENTIN 800 MG PO TABS
ORAL_TABLET | ORAL | 1 refills | Status: DC
  Filled 2020-10-28: qty 90, 30d supply, fill #0

## 2020-11-09 ENCOUNTER — Other Ambulatory Visit: Payer: Self-pay

## 2020-11-09 DIAGNOSIS — B0229 Other postherpetic nervous system involvement: Secondary | ICD-10-CM | POA: Diagnosis not present

## 2020-11-09 MED ORDER — PREGABALIN 50 MG PO CAPS
ORAL_CAPSULE | ORAL | 1 refills | Status: DC
  Filled 2020-11-09: qty 90, 30d supply, fill #0

## 2020-11-11 ENCOUNTER — Other Ambulatory Visit: Payer: Self-pay

## 2020-11-12 ENCOUNTER — Other Ambulatory Visit: Payer: Self-pay

## 2020-11-15 ENCOUNTER — Other Ambulatory Visit: Payer: Self-pay

## 2020-11-19 ENCOUNTER — Ambulatory Visit: Payer: Self-pay | Admitting: Urology

## 2020-12-14 DIAGNOSIS — K219 Gastro-esophageal reflux disease without esophagitis: Secondary | ICD-10-CM | POA: Diagnosis not present

## 2020-12-14 DIAGNOSIS — B0229 Other postherpetic nervous system involvement: Secondary | ICD-10-CM | POA: Diagnosis not present

## 2020-12-14 DIAGNOSIS — E1165 Type 2 diabetes mellitus with hyperglycemia: Secondary | ICD-10-CM | POA: Diagnosis not present

## 2020-12-14 DIAGNOSIS — I10 Essential (primary) hypertension: Secondary | ICD-10-CM | POA: Diagnosis not present

## 2020-12-14 DIAGNOSIS — C61 Malignant neoplasm of prostate: Secondary | ICD-10-CM | POA: Diagnosis not present

## 2020-12-14 DIAGNOSIS — R739 Hyperglycemia, unspecified: Secondary | ICD-10-CM | POA: Diagnosis not present

## 2020-12-24 ENCOUNTER — Other Ambulatory Visit: Payer: Self-pay

## 2020-12-24 DIAGNOSIS — B0229 Other postherpetic nervous system involvement: Secondary | ICD-10-CM | POA: Diagnosis not present

## 2020-12-24 DIAGNOSIS — I10 Essential (primary) hypertension: Secondary | ICD-10-CM | POA: Diagnosis not present

## 2020-12-24 DIAGNOSIS — E1165 Type 2 diabetes mellitus with hyperglycemia: Secondary | ICD-10-CM | POA: Diagnosis not present

## 2020-12-24 DIAGNOSIS — C61 Malignant neoplasm of prostate: Secondary | ICD-10-CM | POA: Diagnosis not present

## 2020-12-24 MED ORDER — TOPIRAMATE 25 MG PO TABS
25.0000 mg | ORAL_TABLET | Freq: Two times a day (BID) | ORAL | 5 refills | Status: DC
  Filled 2020-12-24: qty 60, 30d supply, fill #0
  Filled 2021-01-27: qty 60, 30d supply, fill #1
  Filled 2021-02-28: qty 60, 30d supply, fill #2
  Filled 2021-03-31: qty 60, 30d supply, fill #3
  Filled 2021-05-02: qty 60, 30d supply, fill #4
  Filled 2021-05-18: qty 60, 30d supply, fill #5

## 2020-12-24 MED ORDER — DULOXETINE HCL 30 MG PO CPEP
ORAL_CAPSULE | ORAL | 5 refills | Status: DC
  Filled 2020-12-24: qty 30, 30d supply, fill #0
  Filled 2021-01-27: qty 30, 30d supply, fill #1
  Filled 2021-02-28: qty 30, 30d supply, fill #2
  Filled 2021-03-31: qty 30, 30d supply, fill #3
  Filled 2021-05-02: qty 30, 30d supply, fill #4
  Filled 2021-05-18: qty 30, 30d supply, fill #5

## 2021-01-10 ENCOUNTER — Other Ambulatory Visit: Payer: Self-pay

## 2021-01-10 DIAGNOSIS — R202 Paresthesia of skin: Secondary | ICD-10-CM | POA: Diagnosis not present

## 2021-01-10 DIAGNOSIS — R200 Anesthesia of skin: Secondary | ICD-10-CM | POA: Diagnosis not present

## 2021-01-10 DIAGNOSIS — B0229 Other postherpetic nervous system involvement: Secondary | ICD-10-CM | POA: Diagnosis not present

## 2021-01-10 DIAGNOSIS — R52 Pain, unspecified: Secondary | ICD-10-CM | POA: Diagnosis not present

## 2021-01-10 MED ORDER — PREGABALIN 75 MG PO CAPS
ORAL_CAPSULE | ORAL | 3 refills | Status: DC
  Filled 2021-01-10: qty 90, 30d supply, fill #0
  Filled 2021-02-17: qty 90, 30d supply, fill #1

## 2021-01-12 ENCOUNTER — Other Ambulatory Visit: Payer: Self-pay

## 2021-01-12 MED ORDER — SIMVASTATIN 40 MG PO TABS
40.0000 mg | ORAL_TABLET | Freq: Every day | ORAL | 1 refills | Status: DC
  Filled 2021-01-12: qty 90, 90d supply, fill #0
  Filled 2021-04-19: qty 90, 90d supply, fill #1

## 2021-01-12 MED ORDER — LISINOPRIL 40 MG PO TABS
ORAL_TABLET | ORAL | 1 refills | Status: DC
  Filled 2021-01-12: qty 90, 90d supply, fill #0
  Filled 2021-04-19: qty 90, 90d supply, fill #1

## 2021-01-12 MED ORDER — TAMSULOSIN HCL 0.4 MG PO CAPS
ORAL_CAPSULE | ORAL | 1 refills | Status: DC
  Filled 2021-01-12: qty 90, 90d supply, fill #0
  Filled 2021-04-19: qty 90, 90d supply, fill #1

## 2021-01-12 MED ORDER — METOPROLOL SUCCINATE ER 25 MG PO TB24
25.0000 mg | ORAL_TABLET | Freq: Every day | ORAL | 1 refills | Status: DC
  Filled 2021-01-12: qty 90, 90d supply, fill #0
  Filled 2021-04-19: qty 90, 90d supply, fill #1

## 2021-01-27 ENCOUNTER — Other Ambulatory Visit: Payer: Self-pay

## 2021-02-03 ENCOUNTER — Other Ambulatory Visit: Payer: Self-pay

## 2021-02-03 DIAGNOSIS — B0229 Other postherpetic nervous system involvement: Secondary | ICD-10-CM | POA: Diagnosis not present

## 2021-02-03 DIAGNOSIS — M5440 Lumbago with sciatica, unspecified side: Secondary | ICD-10-CM | POA: Diagnosis not present

## 2021-02-03 DIAGNOSIS — M4316 Spondylolisthesis, lumbar region: Secondary | ICD-10-CM | POA: Diagnosis not present

## 2021-02-03 MED ORDER — PREDNISONE 20 MG PO TABS
ORAL_TABLET | ORAL | 0 refills | Status: DC
  Filled 2021-02-03: qty 18, 9d supply, fill #0

## 2021-02-10 DIAGNOSIS — E119 Type 2 diabetes mellitus without complications: Secondary | ICD-10-CM | POA: Diagnosis not present

## 2021-02-17 ENCOUNTER — Other Ambulatory Visit: Payer: Self-pay

## 2021-02-21 ENCOUNTER — Other Ambulatory Visit: Payer: Self-pay | Admitting: Family Medicine

## 2021-02-21 DIAGNOSIS — M4807 Spinal stenosis, lumbosacral region: Secondary | ICD-10-CM | POA: Diagnosis not present

## 2021-02-21 DIAGNOSIS — M5416 Radiculopathy, lumbar region: Secondary | ICD-10-CM | POA: Diagnosis not present

## 2021-02-21 DIAGNOSIS — M5136 Other intervertebral disc degeneration, lumbar region: Secondary | ICD-10-CM | POA: Diagnosis not present

## 2021-02-28 ENCOUNTER — Other Ambulatory Visit: Payer: Self-pay

## 2021-03-03 ENCOUNTER — Ambulatory Visit
Admission: RE | Admit: 2021-03-03 | Discharge: 2021-03-03 | Disposition: A | Discharge status: Home / Self Care | Payer: 59 | Source: Ambulatory Visit | Attending: Family Medicine | Admitting: Family Medicine

## 2021-03-03 ENCOUNTER — Other Ambulatory Visit: Payer: Self-pay

## 2021-03-03 DIAGNOSIS — M5416 Radiculopathy, lumbar region: Secondary | ICD-10-CM | POA: Diagnosis not present

## 2021-03-03 DIAGNOSIS — M5126 Other intervertebral disc displacement, lumbar region: Secondary | ICD-10-CM | POA: Diagnosis not present

## 2021-03-03 DIAGNOSIS — M48061 Spinal stenosis, lumbar region without neurogenic claudication: Secondary | ICD-10-CM | POA: Diagnosis not present

## 2021-03-03 DIAGNOSIS — M4316 Spondylolisthesis, lumbar region: Secondary | ICD-10-CM | POA: Diagnosis not present

## 2021-03-03 DIAGNOSIS — M47816 Spondylosis without myelopathy or radiculopathy, lumbar region: Secondary | ICD-10-CM | POA: Diagnosis not present

## 2021-03-03 DIAGNOSIS — M5136 Other intervertebral disc degeneration, lumbar region: Secondary | ICD-10-CM | POA: Diagnosis not present

## 2021-03-11 ENCOUNTER — Other Ambulatory Visit: Payer: Self-pay

## 2021-03-11 DIAGNOSIS — M5416 Radiculopathy, lumbar region: Secondary | ICD-10-CM | POA: Diagnosis not present

## 2021-03-11 DIAGNOSIS — M4807 Spinal stenosis, lumbosacral region: Secondary | ICD-10-CM | POA: Diagnosis not present

## 2021-03-11 DIAGNOSIS — M5136 Other intervertebral disc degeneration, lumbar region: Secondary | ICD-10-CM | POA: Diagnosis not present

## 2021-03-11 MED ORDER — TRAMADOL HCL 50 MG PO TABS
ORAL_TABLET | ORAL | 0 refills | Status: DC
  Filled 2021-03-11: qty 14, 7d supply, fill #0

## 2021-03-15 ENCOUNTER — Other Ambulatory Visit: Payer: Self-pay

## 2021-03-25 DIAGNOSIS — M5416 Radiculopathy, lumbar region: Secondary | ICD-10-CM | POA: Diagnosis not present

## 2021-03-25 DIAGNOSIS — M48062 Spinal stenosis, lumbar region with neurogenic claudication: Secondary | ICD-10-CM | POA: Diagnosis not present

## 2021-03-31 ENCOUNTER — Other Ambulatory Visit: Payer: Self-pay

## 2021-04-15 DIAGNOSIS — M48062 Spinal stenosis, lumbar region with neurogenic claudication: Secondary | ICD-10-CM | POA: Diagnosis not present

## 2021-04-15 DIAGNOSIS — M5136 Other intervertebral disc degeneration, lumbar region: Secondary | ICD-10-CM | POA: Diagnosis not present

## 2021-04-15 DIAGNOSIS — M5416 Radiculopathy, lumbar region: Secondary | ICD-10-CM | POA: Diagnosis not present

## 2021-04-19 ENCOUNTER — Other Ambulatory Visit: Payer: Self-pay

## 2021-04-20 ENCOUNTER — Other Ambulatory Visit: Payer: Self-pay

## 2021-04-20 DIAGNOSIS — C61 Malignant neoplasm of prostate: Secondary | ICD-10-CM

## 2021-04-21 ENCOUNTER — Other Ambulatory Visit: Payer: 59

## 2021-04-21 DIAGNOSIS — C61 Malignant neoplasm of prostate: Secondary | ICD-10-CM | POA: Diagnosis not present

## 2021-04-22 LAB — PSA: Prostate Specific Ag, Serum: <0.1 ng/mL (ref 0.0–4.0)

## 2021-04-27 NOTE — Progress Notes (Signed)
05/05/21 ?4:28 PM  ? ?Ricky Avery ?[birth year] ?[id number] ? ?Referring provider:  ?Ricky Harrier, MD ?[place] [facility] ?[place],  [place] [address] ? ?Chief Complaint  ?Patient presents with  ? Prostate Cancer  ? Follow-up  ? ? ?Urological history  ?1. Prostate cancer ?-PSA undetectable on 04/21/2021 ?-diagnosed with stage I Gleason 3+4 prostate cancer involving 1 out of 6 core biopsies in 10/2013 .  His presenting PSA was 3.7 ?-underwent treatment with external beam radiation -released from care 06/2019 ?  ?2. LU TS (post-void dribbling) ?-I PSS 13/3 ?-managed with tamsulosin 0.4 mg daily  ?  ?3. ED ?-contributing factors of age, pelvic radiation, BPH, prostate cancer, HTN and HLD   ?-failed PDE5i's ?-not interested in other treatment modalities  ?  ?HPI: ?Ricky Avery is a 66 y.o.male who presents today for a 6 month follow-up with IPSS/SHIM, and prior PSA.  He is accompanied by a Patent attorney, Quarry manager .  ? ?He is doing well today, he has been urinating well. He is still having trouble with ED but he is not interested at this time in any intervention.   ? ?Patient denies any modifying or aggravating factors.  Patient denies any gross hematuria, dysuria or suprapubic/flank pain.  Patient denies any fevers, chills, nausea or vomiting.  ? ? IPSS   ? ? [employer] Name 04/28/21 0900  ?  ?  ?  ? International Prostate Symptom Score  ? How often have you had the sensation of not emptying your bladder? Less than half the time    ? How often have you had to urinate less than every two hours? About half the time    ? How often have you found you stopped and started again several times when you urinated? About half the time    ? How often have you found it difficult to postpone urination? Less than 1 in 5 times    ? How often have you had a weak urinary stream? Less than 1 in 5 times    ? How often have you had to strain to start urination? Not at All    ? How many times did you typically get  up at night to urinate? 3 Times    ? Total IPSS Score 13    ?  ? Quality of Life due to urinary symptoms  ? If you were to spend the rest of your life with your urinary condition just the way it is now how would you feel about that? Mixed    ? ?  ?  ? ?  ? ? ?Score:  ?1-7 Mild ?8-19 Moderate ?20-35 Severe ? ? ?PMH: ?Past Medical History:  ?Diagnosis Date  ? Hypercholesteremia   ? Hypertension   ? Prostate cancer ([place])   ? ? ?Surgical History: ?Past Surgical History:  ?Procedure Laterality Date  ? CARDIAC CATHETERIZATION    ? ? ?Home Medications:  ?Allergies as of 04/28/2021   ? ?   Reactions  ? No Known Allergies   ? ?  ? ?  ?Medication List  ?  ? ?  ? Accurate as of April 28, 2021 11:59 PM. If you have any questions, ask your nurse or doctor.  ?  ?  ? ?  ? ?aspirin 81 MG tablet ?Take 81 mg by mouth daily. ?  ?docusate sodium 100 MG capsule ?Commonly known as: COLACE ?Take 1 capsule (100 mg total) by mouth 2 (two) times daily for 10 days ?  ?  DULoxetine 30 MG capsule ?Commonly known as: CYMBALTA ?Take 1 capsule (30 mg total) by mouth once daily ?  ?gabapentin 100 MG capsule ?Commonly known as: NEURONTIN ?Take 1 capsule (100 mg total) by mouth 3 (three) times daily for pain from shingles ?(Take 1 capsule (100 mg total) by mouth 3 (three) times daily for 90 days For pain from shingles) ?  ?gabapentin 300 MG capsule ?Commonly known as: NEURONTIN ?Take 1 capsule (300 mg total) by mouth 3 (three) times daily ?  ?gabapentin 600 MG tablet ?Commonly known as: NEURONTIN ?Take 1 tablet (600 mg total) by mouth 3 (three) times daily ?  ?gabapentin 600 MG tablet ?Commonly known as: NEURONTIN ?Take 1 tablet (600 mg total) by mouth 3 (three) times daily ?  ?gabapentin 800 MG tablet ?Commonly known as: NEURONTIN ?Take 1 tablet (800 mg total) by mouth 3 (three) times daily for 30 days ?  ?glimepiride 1 MG tablet ?Commonly known as: AMARYL ?Tome 1 tableta (1 mg en total) por v?a oral diariamente con el desayuno. ?(Take 1 tablet (1 mg  total) by mouth daily with breakfast) ?  ?HYDROcodone-acetaminophen 5-325 MG tablet ?Commonly known as: NORCO/VICODIN ?Take 1 tablet by mouth every 4 (four) hours as needed for moderate pain. ?  ?Lidocaine 3 % Crea ?Apply 1 Application topically 2 (two) times daily as needed ?  ?lidocaine 3 % Crea cream ?Commonly known as: LINDAMANTLE ?Apply to the affected area(s) two times daily as needed for pain ?(1 application 2 (two) times daily as needed (pain).) ?  ?lidocaine 3 % Crea cream ?Commonly known as: LINDAMANTLE ?Aplique de manera t?pica. ?(Apply topically.) ?  ?lidocaine 3 % Crea cream ?Commonly known as: LINDAMANTLE ?Apply two times daily as needed. ?  ?lidocaine 3 % Crea cream ?Commonly known as: LINDAMANTLE ?Aplicar dos veces al dia segun sea necesario ?(Apply 1 application topically 2 (two) times daily.) ?  ?lisinopril 40 MG tablet ?Commonly known as: ZESTRIL ?Take 40 mg by mouth daily. ?  ?lisinopril 40 MG tablet ?Commonly known as: ZESTRIL ?Tome 1 tableta por v?a oral una vez al d?a. ?(Take 1 tablet by mouth once daily) ?  ?meloxicam 15 MG tablet ?Commonly known as: MOBIC ?  ?metFORMIN 500 MG tablet ?Commonly known as: GLUCOPHAGE ?Take 1 tablet (500 mg total) by mouth 2 (two) times daily with meals ?  ?metoprolol succinate 25 MG 24 hr tablet ?Commonly known as: TOPROL-XL ?Take 25 mg by mouth daily. ?  ?metoprolol succinate 25 MG 24 hr tablet ?Commonly known as: TOPROL-XL ?Take 1 tablet (25 mg total) by mouth once daily ?  ?oxyCODONE-acetaminophen 5-325 MG tablet ?Commonly known as: PERCOCET/ROXICET ?Take 1 tablet by mouth 2 (two) times daily as needed for Pain for up to 30 days ?  ?Pfizer-BioNT COVID-19 Vac-TriS Susp injection ?Generic drug: COVID-19 mRNA Vac-TriS [employer]) ?Inject into the muscle. ?  ?Potassium 99 MG Tabs ?Take by mouth. ?  ?predniSONE 10 MG tablet ?Commonly known as: DELTASONE ?Take 6 tabs daily for 2 days, 5 tabs daily for 2 days, 4 tabs daily for 2 days, 3 tabs daily for 2 days, 2 tabs  for 2 days, 1 tab daily for 2 days then stop. ?(Take 1 tablet (10 mg total) by mouth as directed.) ?  ?predniSONE 20 MG tablet ?Commonly known as: DELTASONE ?Take 3 tablets (60 mg total) by mouth once daily for 3 days, THEN 2 tablets (40 mg total) once daily for 3 days, THEN 1 tablet (20 mg total) once daily for 3 days. ?  ?  pregabalin 25 MG capsule ?Commonly known as: LYRICA ?Take 1 capsule by mouth 3 times a day for a week, then start '50mg'$  capsules ?(Take 25 mg three times a day for a week then start 50 mg pills) ?  ?pregabalin 50 MG capsule ?Commonly known as: LYRICA ?After finishing '25mg'$  capsules, take 1 capsule by mouth 3 times a day ?(After finishing 25 mg capsules, take 1 capsule (50 mg) three times a day) ?  ?pregabalin 50 MG capsule ?Commonly known as: LYRICA ?Take 1 capsule (50 mg total) by mouth 3 (three) times daily for 30 days ?  ?pregabalin 75 MG capsule ?Commonly known as: LYRICA ?Take 1 capsule (75 mg total) by mouth 3 (three) times daily ?  ?sildenafil 100 MG tablet ?Commonly known as: VIAGRA ?TAKE 1 TABLET BY MOUTH DAILY AS NEEDED FOR ERECTILE DYSFUNCTION ?  ?simvastatin 40 MG tablet ?Commonly known as: ZOCOR ?Take 40 mg by mouth daily. ?  ?simvastatin 40 MG tablet ?Commonly known as: ZOCOR ?Tome 1 tableta (40 mg en total) por v?a oral antes de acostarse. ?(Take 1 tablet (40 mg total) by mouth at bedtime) ?  ?tadalafil 20 MG tablet ?Commonly known as: CIALIS ?Take 1 tablet (20 mg total) by mouth daily as needed for erectile dysfunction. ?  ?tamsulosin 0.4 MG Caps capsule ?Commonly known as: FLOMAX ?Take 1 capsule (0.4 mg total) by mouth daily. ?  ?tamsulosin 0.4 MG Caps capsule ?Commonly known as: FLOMAX ?TAKE 1 CAPSULE BY MOUTH ONCE DAILY 30 MINUTES AFTER SAME MEAL EACH DAY. ?  ?tamsulosin 0.4 MG Caps capsule ?Commonly known as: FLOMAX ?Take 1 capsule (0.4 mg total) by mouth once daily 30 MINUTES AFTER SAME MEAL EACH DAY ?  ?topiramate 25 MG tablet ?Commonly known as: TOPAMAX ?Take 1 tablet (25 mg  total) by mouth 2 (two) times daily ?  ?traMADol 50 MG tablet ?Commonly known as: ULTRAM ?Take 1 tablet (50 mg total) by mouth every 6 (six) hours as needed for up to 10 days ?  ?traMADol 50 MG tablet ?Co

## 2021-04-28 ENCOUNTER — Ambulatory Visit (INDEPENDENT_AMBULATORY_CARE_PROVIDER_SITE_OTHER): Payer: 59 | Admitting: Urology

## 2021-04-28 VITALS — BP 116/71 | HR 70 | Ht 64.0 in | Wt 158.0 lb

## 2021-04-28 DIAGNOSIS — N138 Other obstructive and reflux uropathy: Secondary | ICD-10-CM

## 2021-04-28 DIAGNOSIS — N401 Enlarged prostate with lower urinary tract symptoms: Secondary | ICD-10-CM

## 2021-04-28 DIAGNOSIS — N529 Male erectile dysfunction, unspecified: Secondary | ICD-10-CM | POA: Diagnosis not present

## 2021-04-28 DIAGNOSIS — C61 Malignant neoplasm of prostate: Secondary | ICD-10-CM

## 2021-05-02 ENCOUNTER — Other Ambulatory Visit: Payer: Self-pay

## 2021-05-05 ENCOUNTER — Encounter: Payer: Self-pay | Admitting: Urology

## 2021-05-13 DIAGNOSIS — M48062 Spinal stenosis, lumbar region with neurogenic claudication: Secondary | ICD-10-CM | POA: Diagnosis not present

## 2021-05-13 DIAGNOSIS — M5416 Radiculopathy, lumbar region: Secondary | ICD-10-CM | POA: Diagnosis not present

## 2021-05-17 DIAGNOSIS — E1165 Type 2 diabetes mellitus with hyperglycemia: Secondary | ICD-10-CM | POA: Diagnosis not present

## 2021-05-17 DIAGNOSIS — I10 Essential (primary) hypertension: Secondary | ICD-10-CM | POA: Diagnosis not present

## 2021-05-17 DIAGNOSIS — C61 Malignant neoplasm of prostate: Secondary | ICD-10-CM | POA: Diagnosis not present

## 2021-05-17 DIAGNOSIS — B0229 Other postherpetic nervous system involvement: Secondary | ICD-10-CM | POA: Diagnosis not present

## 2021-05-18 ENCOUNTER — Other Ambulatory Visit: Payer: Self-pay

## 2021-05-18 MED ORDER — TOPIRAMATE 25 MG PO TABS
ORAL_TABLET | ORAL | 0 refills | Status: DC
  Filled 2021-05-18: qty 60, 30d supply, fill #0

## 2021-05-18 MED ORDER — DULOXETINE HCL 30 MG PO CPEP
ORAL_CAPSULE | ORAL | 0 refills | Status: DC
  Filled 2021-05-18: qty 30, 30d supply, fill #0

## 2021-05-19 ENCOUNTER — Other Ambulatory Visit: Payer: Self-pay

## 2021-05-19 DIAGNOSIS — M25561 Pain in right knee: Secondary | ICD-10-CM | POA: Diagnosis not present

## 2021-05-19 DIAGNOSIS — G8929 Other chronic pain: Secondary | ICD-10-CM | POA: Diagnosis not present

## 2021-05-19 DIAGNOSIS — R739 Hyperglycemia, unspecified: Secondary | ICD-10-CM | POA: Diagnosis not present

## 2021-05-19 DIAGNOSIS — B0229 Other postherpetic nervous system involvement: Secondary | ICD-10-CM | POA: Diagnosis not present

## 2021-05-19 DIAGNOSIS — E1165 Type 2 diabetes mellitus with hyperglycemia: Secondary | ICD-10-CM | POA: Diagnosis not present

## 2021-05-19 MED ORDER — TAMSULOSIN HCL 0.4 MG PO CAPS
ORAL_CAPSULE | ORAL | 1 refills | Status: DC
  Filled 2021-05-19: qty 90, 90d supply, fill #0

## 2021-05-19 MED ORDER — METOPROLOL SUCCINATE ER 25 MG PO TB24
25.0000 mg | ORAL_TABLET | Freq: Every day | ORAL | 1 refills | Status: AC
  Filled 2021-05-19: qty 90, 90d supply, fill #0

## 2021-05-19 MED ORDER — LISINOPRIL 40 MG PO TABS
ORAL_TABLET | ORAL | 1 refills | Status: DC
Start: 2021-05-19 — End: 2023-05-15
  Filled 2021-05-19: qty 90, 90d supply, fill #0

## 2021-05-19 MED ORDER — SIMVASTATIN 40 MG PO TABS
40.0000 mg | ORAL_TABLET | Freq: Every day | ORAL | 1 refills | Status: DC
  Filled 2021-05-19: qty 90, 90d supply, fill #0

## 2021-05-19 MED ORDER — METFORMIN HCL 500 MG PO TABS
ORAL_TABLET | ORAL | 1 refills | Status: AC
  Filled 2021-05-19: qty 180, 90d supply, fill #0

## 2021-05-20 ENCOUNTER — Other Ambulatory Visit: Payer: Self-pay

## 2022-02-24 DIAGNOSIS — C61 Malignant neoplasm of prostate: Secondary | ICD-10-CM | POA: Diagnosis not present

## 2022-02-24 DIAGNOSIS — E1165 Type 2 diabetes mellitus with hyperglycemia: Secondary | ICD-10-CM | POA: Diagnosis not present

## 2022-02-24 DIAGNOSIS — B0229 Other postherpetic nervous system involvement: Secondary | ICD-10-CM | POA: Diagnosis not present

## 2022-02-24 DIAGNOSIS — M25561 Pain in right knee: Secondary | ICD-10-CM | POA: Diagnosis not present

## 2022-02-24 DIAGNOSIS — I10 Essential (primary) hypertension: Secondary | ICD-10-CM | POA: Diagnosis not present

## 2022-02-24 DIAGNOSIS — R739 Hyperglycemia, unspecified: Secondary | ICD-10-CM | POA: Diagnosis not present

## 2022-02-24 DIAGNOSIS — G8929 Other chronic pain: Secondary | ICD-10-CM | POA: Diagnosis not present

## 2022-03-01 DIAGNOSIS — M5416 Radiculopathy, lumbar region: Secondary | ICD-10-CM | POA: Diagnosis not present

## 2022-03-01 DIAGNOSIS — B0229 Other postherpetic nervous system involvement: Secondary | ICD-10-CM | POA: Diagnosis not present

## 2022-03-01 DIAGNOSIS — Z8546 Personal history of malignant neoplasm of prostate: Secondary | ICD-10-CM | POA: Diagnosis not present

## 2022-03-01 DIAGNOSIS — I10 Essential (primary) hypertension: Secondary | ICD-10-CM | POA: Diagnosis not present

## 2022-03-01 DIAGNOSIS — Z23 Encounter for immunization: Secondary | ICD-10-CM | POA: Diagnosis not present

## 2022-03-01 DIAGNOSIS — M48062 Spinal stenosis, lumbar region with neurogenic claudication: Secondary | ICD-10-CM | POA: Diagnosis not present

## 2022-03-01 DIAGNOSIS — E119 Type 2 diabetes mellitus without complications: Secondary | ICD-10-CM | POA: Diagnosis not present

## 2022-03-01 DIAGNOSIS — E1165 Type 2 diabetes mellitus with hyperglycemia: Secondary | ICD-10-CM | POA: Diagnosis not present

## 2022-03-01 DIAGNOSIS — Z0000 Encounter for general adult medical examination without abnormal findings: Secondary | ICD-10-CM | POA: Diagnosis not present

## 2022-03-01 DIAGNOSIS — M5136 Other intervertebral disc degeneration, lumbar region: Secondary | ICD-10-CM | POA: Diagnosis not present

## 2022-03-02 DIAGNOSIS — K219 Gastro-esophageal reflux disease without esophagitis: Secondary | ICD-10-CM | POA: Diagnosis not present

## 2022-03-02 DIAGNOSIS — E1165 Type 2 diabetes mellitus with hyperglycemia: Secondary | ICD-10-CM | POA: Diagnosis not present

## 2022-03-02 DIAGNOSIS — I214 Non-ST elevation (NSTEMI) myocardial infarction: Secondary | ICD-10-CM | POA: Diagnosis not present

## 2022-03-02 DIAGNOSIS — I2510 Atherosclerotic heart disease of native coronary artery without angina pectoris: Secondary | ICD-10-CM | POA: Diagnosis not present

## 2022-03-02 DIAGNOSIS — E7800 Pure hypercholesterolemia, unspecified: Secondary | ICD-10-CM | POA: Diagnosis not present

## 2022-03-02 DIAGNOSIS — I10 Essential (primary) hypertension: Secondary | ICD-10-CM | POA: Diagnosis not present

## 2022-03-15 DIAGNOSIS — M5126 Other intervertebral disc displacement, lumbar region: Secondary | ICD-10-CM | POA: Diagnosis not present

## 2022-03-15 DIAGNOSIS — M5416 Radiculopathy, lumbar region: Secondary | ICD-10-CM | POA: Diagnosis not present

## 2022-04-05 DIAGNOSIS — M5416 Radiculopathy, lumbar region: Secondary | ICD-10-CM | POA: Diagnosis not present

## 2022-04-05 DIAGNOSIS — M5136 Other intervertebral disc degeneration, lumbar region: Secondary | ICD-10-CM | POA: Diagnosis not present

## 2022-04-06 DIAGNOSIS — M5126 Other intervertebral disc displacement, lumbar region: Secondary | ICD-10-CM | POA: Diagnosis not present

## 2022-04-06 DIAGNOSIS — M5416 Radiculopathy, lumbar region: Secondary | ICD-10-CM | POA: Diagnosis not present

## 2022-04-19 DIAGNOSIS — H40003 Preglaucoma, unspecified, bilateral: Secondary | ICD-10-CM | POA: Diagnosis not present

## 2022-04-19 DIAGNOSIS — H524 Presbyopia: Secondary | ICD-10-CM | POA: Diagnosis not present

## 2022-04-19 DIAGNOSIS — E119 Type 2 diabetes mellitus without complications: Secondary | ICD-10-CM | POA: Diagnosis not present

## 2022-04-26 ENCOUNTER — Other Ambulatory Visit: Payer: Self-pay

## 2022-04-26 DIAGNOSIS — C61 Malignant neoplasm of prostate: Secondary | ICD-10-CM

## 2022-04-27 ENCOUNTER — Other Ambulatory Visit: Payer: Medicare HMO

## 2022-04-27 DIAGNOSIS — C61 Malignant neoplasm of prostate: Secondary | ICD-10-CM

## 2022-04-28 LAB — PSA: Prostate Specific Ag, Serum: 0.1 ng/mL (ref 0.0–4.0)

## 2022-05-01 NOTE — Progress Notes (Unsigned)
05/02/22 10:37 AM   [employer] [birth year] [id number]  Referring provider:  Barbette Reichmann, MD [address] [facility] [place],  [place] [address]   Urological history  1. Prostate cancer -PSA (04/2022) -0.1 -diagnosed with stage I Gleason 3+4 prostate cancer involving 1 out of 6 core biopsies in 10/2013 .  His presenting PSA was 3.7 -underwent treatment with external beam radiation -released from care 06/2019   2. LU TS (post-void dribbling) -tamsulosin 0.4 mg daily    3. ED -contributing factors of age, pelvic radiation, BPH, prostate cancer, HTN and HLD   -failed PDE5i's -not interested in other treatment modalities    HPI: Ricky Avery is a 67 y.o.male who presents today for a 12 month follow-up.     I PSS 5/2  He has no issues with urination at this time.   Patient denies any modifying or aggravating factors.  Patient denies any gross hematuria, dysuria or suprapubic/flank pain.  Patient denies any fevers, chills, nausea or vomiting.  He continues to take tamsulosin.  He is having some itching in the scrotal and groin area in the evening.  He does state that the sildenafil is not effective and he is wondering if there is another treatment that is effective.   IPSS     Row Name 05/02/22 0900         International Prostate Symptom Score   How often have you had the sensation of not emptying your bladder? Not at All     How often have you had to urinate less than every two hours? About half the time     How often have you found you stopped and started again several times when you urinated? Not at All     How often have you found it difficult to postpone urination? Not at All     How often have you had a weak urinary stream? Not at All     How often have you had to strain to start urination? Not at All     How many times did you typically get up at night to urinate? 2 Times     Total IPSS Score 5       Quality of Life due to urinary  symptoms   If you were to spend the rest of your life with your urinary condition just the way it is now how would you feel about that? Mostly Satisfied               Score:  1-7 Mild 8-19 Moderate 20-35 Severe   PMH: Past Medical History:  Diagnosis Date   Hypercholesteremia    Hypertension    Prostate cancer     Surgical History: Past Surgical History:  Procedure Laterality Date   CARDIAC CATHETERIZATION      Home Medications:  Allergies as of 05/02/2022       Reactions   No Known Allergies         Medication List        Accurate as of May 02, 2022 10:37 AM. If you have any questions, ask your nurse or doctor.          STOP taking these medications    docusate sodium 100 MG capsule Commonly known as: COLACE Stopped by: Rykar Lebleu, PA-C   DULoxetine 30 MG capsule Commonly known as: CYMBALTA Stopped by: Kilie Rund, PA-C   gabapentin 100 MG capsule Commonly known as: NEURONTIN Stopped by: Michiel Cowboy, PA-C  gabapentin 300 MG capsule Commonly known as: NEURONTIN Stopped by: Demaris Bousquet, PA-C   gabapentin 600 MG tablet Commonly known as: NEURONTIN Stopped by: Armella Stogner Moriya Mitchell, PA-C   gabapentin 800 MG tablet Commonly known as: NEURONTIN Stopped by: Arley Garant Nyala Kirchner, PA-C   glimepiride 1 MG tablet Commonly known as: AMARYL Stopped by: Bertrum Helmstetter Christorpher Hisaw, PA-C   Lidocaine 3 % Crea Stopped by: Caliope Ruppert Damon Baisch, PA-C   lidocaine 3 % Crea cream Commonly known as: LINDAMANTLE Stopped by: Juhi Lagrange, PA-C   oxyCODONE-acetaminophen 5-325 MG tablet Commonly known as: PERCOCET/ROXICET Stopped by: Michiel CowboySHANNON Savino Whisenant, PA-C   Potassium 99 MG Tabs Stopped by: Charrie Mcconnon, PA-C   predniSONE 10 MG tablet Commonly known as: DELTASONE Stopped by: Theran Vandergrift, PA-C   predniSONE 20 MG tablet Commonly known as: DELTASONE Stopped by: Terasa Orsini, PA-C   pregabalin 25 MG capsule Commonly known as: LYRICA Stopped  by: Carina Chaplin, PA-C   pregabalin 50 MG capsule Commonly known as: LYRICA Stopped by: Kimberli Winne, PA-C   pregabalin 75 MG capsule Commonly known as: LYRICA Stopped by: Thiago Ragsdale, PA-C   sildenafil 100 MG tablet Commonly known as: VIAGRA Stopped by: Shaleah Nissley, PA-C   tamsulosin 0.4 MG Caps capsule Commonly known as: FLOMAX Stopped by: Telena Peyser, PA-C   traMADol 50 MG tablet Commonly known as: ULTRAM Stopped by: Michiel CowboySHANNON Yonna Alwin, PA-C       TAKE these medications    aspirin 81 MG tablet Take 81 mg by mouth daily.   lisinopril 40 MG tablet Commonly known as: ZESTRIL Take 40 mg by mouth daily.   lisinopril 40 MG tablet Commonly known as: ZESTRIL Take one tablet by mouth once daily   meloxicam 15 MG tablet Commonly known as: MOBIC   metFORMIN 500 MG tablet Commonly known as: GLUCOPHAGE Take 1 tablet (500 mg total) by mouth 2 (two) times daily with meals What changed: Another medication with the same name was removed. Continue taking this medication, and follow the directions you see here. Changed by: Michiel CowboySHANNON Kiven Vangilder, PA-C   metoprolol succinate 25 MG 24 hr tablet Commonly known as: TOPROL-XL Take 25 mg by mouth daily.   metoprolol succinate 25 MG 24 hr tablet Commonly known as: TOPROL-XL Take 1 tablet (25 mg total) by mouth once daily   nystatin cream Commonly known as: MYCOSTATIN Apply 1 Application topically 2 (two) times daily. Started by: Michiel CowboySHANNON Jd Mccaster, PA-C   Pfizer-BioNT COVID-19 Vac-TriS Susp injection Generic drug: COVID-19 mRNA Vac-TriS (Pfizer) Inject into the muscle.   simvastatin 40 MG tablet Commonly known as: ZOCOR Take 40 mg by mouth daily.   simvastatin 40 MG tablet Commonly known as: ZOCOR Tome 1 tableta (40 mg en total) por va oral antes de acostarse. (Take 1 tablet (40 mg total) by mouth at bedtime)   tadalafil 20 MG tablet Commonly known as: CIALIS Take 1 tablet (20 mg total) by mouth daily as  needed for erectile dysfunction.   topiramate 25 MG tablet Commonly known as: TOPAMAX Take 1 tablet (25 mg total) by mouth 2 (two) times daily for 30 days What changed: Another medication with the same name was removed. Continue taking this medication, and follow the directions you see here. Changed by: Michiel CowboySHANNON Loneta Tamplin, PA-C        Allergies:  Allergies  Allergen Reactions   No Known Allergies     Family History: Family History  Problem Relation Age of Onset   Kidney cancer Neg Hx    Prostate cancer Neg Hx  Bladder Cancer Neg Hx     Social History:  reports that he has quit smoking. He has never used smokeless tobacco. He reports current alcohol use. He reports that he does not use drugs.   Physical Exam: BP 130/78   Pulse 72   Ht 5\' 5"  (1.651 m)   Wt 168 lb (76.2 kg)   BMI 27.96 kg/m   Constitutional:  Well nourished. Alert and oriented, No acute distress. HEENT: [place] AT, moist mucus membranes.  Trachea midline Cardiovascular: No clubbing, cyanosis, or edema. Respiratory: Normal respiratory effort, no increased work of breathing. GU: No CVA tenderness.  No bladder fullness or masses.  Patient with uncircumcised phallus. Foreskin easily retracted  Urethral meatus is patent.  No penile discharge. No penile lesions or rashes. Vitiligo.  Scaling plaques on inguinal folds.  Scrotum without lesions, cysts, rashes and/or edema.   Neurologic: Grossly intact, no focal deficits, moving all 4 extremities. Psychiatric: Normal mood and affect.   Laboratory Data: Component     Latest Ref Rng 04/27/2022  Prostate Specific Ag, Serum     0.0 - 4.0 ng/mL 0.1    Serum creatinine (02/2022) 0.8, eGFR 98 Hemoglobin A1c (02/2022) 7.1 Total cholesterol (02/2022) 99 TSH (02/2022) 1.993  Urinalysis w/Microscopic Order: [id number] Component Ref Range & Units 2 mo ago  Color Colorless, Straw, Light Yellow, Yellow, Dark Yellow Light Yellow  Clarity Clear Clear  Specific Gravity 1.005  - 1.030 1.023  pH, Urine 5.0 - 8.0 5.5  Protein, Urinalysis Negative mg/dL Trace Abnormal   Glucose, Urinalysis Negative mg/dL Negative  Ketones, Urinalysis Negative mg/dL Negative  Blood, Urinalysis Negative Negative  Nitrite, Urinalysis Negative Negative  Leukocyte Esterase, Urinalysis Negative Negative  Bilirubin, Urinalysis Negative Negative  Urobilinogen, Urinalysis 0.2 - 1.0 mg/dL 0.2  WBC, UA <=5 /hpf <1  Red Blood Cells, Urinalysis <=3 /hpf 0  Bacteria, Urinalysis 0 - 5 /hpf 0-5  Squamous Epithelial Cells, Urinalysis /hpf 0  Resulting Agency [facility]   Specimen Collected: 02/24/22 07:44   Performed by: [facility] Last Resulted: 02/24/22 11:01  Received From: [facility]  Result Received: 03/30/22 09:25  I have reviewed the labs.   Pertinent Imaging: N/A  Assessment & Plan:    Prostate cancer  - PSA 0.1 -repeat PSA in 6 months to ensure an upward trend does not continue  2. BPH with LUTS  - Continue conservative management, avoiding bladder irritants and timed voiding's - Continue tamsulosin 0.4 mg daily  3. Erectile dysfunction - PDE5 inhibitors are no longer effective, but would like to try Cialis -Prescription for Cialis 20 mg on-demand dosing sent to [facility] along with a good Rx card -He states he and his wife are very happy without having sexual intercourse, so if the medication is ineffective it is not concerning to him   Return in about 6 months (around 11/01/2022) for PSA only .  Cloretta Ned   [facility] [address] [place], [place] [address] [phone]

## 2022-05-02 ENCOUNTER — Ambulatory Visit: Payer: Medicare HMO | Admitting: Urology

## 2022-05-02 ENCOUNTER — Encounter: Payer: Self-pay | Admitting: Urology

## 2022-05-02 VITALS — BP 130/78 | HR 72 | Ht 65.0 in | Wt 168.0 lb

## 2022-05-02 DIAGNOSIS — B356 Tinea cruris: Secondary | ICD-10-CM | POA: Diagnosis not present

## 2022-05-02 DIAGNOSIS — C61 Malignant neoplasm of prostate: Secondary | ICD-10-CM | POA: Diagnosis not present

## 2022-05-02 DIAGNOSIS — N401 Enlarged prostate with lower urinary tract symptoms: Secondary | ICD-10-CM

## 2022-05-02 DIAGNOSIS — N529 Male erectile dysfunction, unspecified: Secondary | ICD-10-CM

## 2022-05-02 MED ORDER — NYSTATIN 100000 UNIT/GM EX CREA
1.0000 | TOPICAL_CREAM | Freq: Two times a day (BID) | CUTANEOUS | 0 refills | Status: DC
Start: 2022-05-02 — End: 2023-05-15

## 2022-05-02 MED ORDER — TADALAFIL 20 MG PO TABS
20.0000 mg | ORAL_TABLET | Freq: Every day | ORAL | 3 refills | Status: DC | PRN
Start: 2022-05-02 — End: 2023-05-15

## 2022-05-04 DIAGNOSIS — M4807 Spinal stenosis, lumbosacral region: Secondary | ICD-10-CM | POA: Diagnosis not present

## 2022-05-04 DIAGNOSIS — M5416 Radiculopathy, lumbar region: Secondary | ICD-10-CM | POA: Diagnosis not present

## 2022-05-04 DIAGNOSIS — M5136 Other intervertebral disc degeneration, lumbar region: Secondary | ICD-10-CM | POA: Diagnosis not present

## 2022-05-04 DIAGNOSIS — M5126 Other intervertebral disc displacement, lumbar region: Secondary | ICD-10-CM | POA: Diagnosis not present

## 2022-09-26 DIAGNOSIS — C61 Malignant neoplasm of prostate: Secondary | ICD-10-CM | POA: Diagnosis not present

## 2022-09-26 DIAGNOSIS — I10 Essential (primary) hypertension: Secondary | ICD-10-CM | POA: Diagnosis not present

## 2022-09-26 DIAGNOSIS — B0229 Other postherpetic nervous system involvement: Secondary | ICD-10-CM | POA: Diagnosis not present

## 2022-09-26 DIAGNOSIS — Z125 Encounter for screening for malignant neoplasm of prostate: Secondary | ICD-10-CM | POA: Diagnosis not present

## 2022-09-26 DIAGNOSIS — E7800 Pure hypercholesterolemia, unspecified: Secondary | ICD-10-CM | POA: Diagnosis not present

## 2022-09-26 DIAGNOSIS — E1165 Type 2 diabetes mellitus with hyperglycemia: Secondary | ICD-10-CM | POA: Diagnosis not present

## 2022-10-03 ENCOUNTER — Other Ambulatory Visit: Payer: Self-pay | Admitting: Internal Medicine

## 2022-10-03 DIAGNOSIS — I10 Essential (primary) hypertension: Secondary | ICD-10-CM | POA: Diagnosis not present

## 2022-10-03 DIAGNOSIS — Z0000 Encounter for general adult medical examination without abnormal findings: Secondary | ICD-10-CM | POA: Diagnosis not present

## 2022-10-03 DIAGNOSIS — L298 Other pruritus: Secondary | ICD-10-CM | POA: Diagnosis not present

## 2022-10-03 DIAGNOSIS — B0229 Other postherpetic nervous system involvement: Secondary | ICD-10-CM | POA: Diagnosis not present

## 2022-10-03 DIAGNOSIS — K219 Gastro-esophageal reflux disease without esophagitis: Secondary | ICD-10-CM | POA: Diagnosis not present

## 2022-10-03 DIAGNOSIS — R600 Localized edema: Secondary | ICD-10-CM

## 2022-10-03 DIAGNOSIS — E119 Type 2 diabetes mellitus without complications: Secondary | ICD-10-CM | POA: Diagnosis not present

## 2022-10-03 DIAGNOSIS — C61 Malignant neoplasm of prostate: Secondary | ICD-10-CM | POA: Diagnosis not present

## 2022-10-03 DIAGNOSIS — M25473 Effusion, unspecified ankle: Secondary | ICD-10-CM

## 2022-10-03 DIAGNOSIS — R7989 Other specified abnormal findings of blood chemistry: Secondary | ICD-10-CM | POA: Diagnosis not present

## 2022-10-03 DIAGNOSIS — Z1331 Encounter for screening for depression: Secondary | ICD-10-CM | POA: Diagnosis not present

## 2022-10-05 ENCOUNTER — Ambulatory Visit: Admission: RE | Admit: 2022-10-05 | Payer: Medicare HMO | Source: Ambulatory Visit

## 2022-10-11 DIAGNOSIS — M5126 Other intervertebral disc displacement, lumbar region: Secondary | ICD-10-CM | POA: Diagnosis not present

## 2022-10-11 DIAGNOSIS — M5416 Radiculopathy, lumbar region: Secondary | ICD-10-CM | POA: Diagnosis not present

## 2022-10-31 ENCOUNTER — Other Ambulatory Visit: Payer: Self-pay | Admitting: *Deleted

## 2022-10-31 DIAGNOSIS — C61 Malignant neoplasm of prostate: Secondary | ICD-10-CM

## 2022-11-01 ENCOUNTER — Other Ambulatory Visit: Payer: Medicare HMO

## 2022-11-01 DIAGNOSIS — C61 Malignant neoplasm of prostate: Secondary | ICD-10-CM | POA: Diagnosis not present

## 2022-11-02 LAB — PSA: Prostate Specific Ag, Serum: 0.1 ng/mL (ref 0.0–4.0)

## 2022-11-03 DIAGNOSIS — M51362 Other intervertebral disc degeneration, lumbar region with discogenic back pain and lower extremity pain: Secondary | ICD-10-CM | POA: Diagnosis not present

## 2022-11-03 DIAGNOSIS — M4807 Spinal stenosis, lumbosacral region: Secondary | ICD-10-CM | POA: Diagnosis not present

## 2022-11-03 DIAGNOSIS — M5126 Other intervertebral disc displacement, lumbar region: Secondary | ICD-10-CM | POA: Diagnosis not present

## 2022-11-03 DIAGNOSIS — M5416 Radiculopathy, lumbar region: Secondary | ICD-10-CM | POA: Diagnosis not present

## 2022-11-03 DIAGNOSIS — M48062 Spinal stenosis, lumbar region with neurogenic claudication: Secondary | ICD-10-CM | POA: Diagnosis not present

## 2023-01-26 DIAGNOSIS — M5126 Other intervertebral disc displacement, lumbar region: Secondary | ICD-10-CM | POA: Diagnosis not present

## 2023-01-26 DIAGNOSIS — M5416 Radiculopathy, lumbar region: Secondary | ICD-10-CM | POA: Diagnosis not present

## 2023-02-20 DIAGNOSIS — M48062 Spinal stenosis, lumbar region with neurogenic claudication: Secondary | ICD-10-CM | POA: Diagnosis not present

## 2023-02-20 DIAGNOSIS — M5416 Radiculopathy, lumbar region: Secondary | ICD-10-CM | POA: Diagnosis not present

## 2023-02-20 DIAGNOSIS — M4807 Spinal stenosis, lumbosacral region: Secondary | ICD-10-CM | POA: Diagnosis not present

## 2023-02-20 DIAGNOSIS — M5126 Other intervertebral disc displacement, lumbar region: Secondary | ICD-10-CM | POA: Diagnosis not present

## 2023-02-21 DIAGNOSIS — R21 Rash and other nonspecific skin eruption: Secondary | ICD-10-CM | POA: Diagnosis not present

## 2023-02-21 DIAGNOSIS — J069 Acute upper respiratory infection, unspecified: Secondary | ICD-10-CM | POA: Diagnosis not present

## 2023-02-21 DIAGNOSIS — I10 Essential (primary) hypertension: Secondary | ICD-10-CM | POA: Diagnosis not present

## 2023-02-21 DIAGNOSIS — Z03818 Encounter for observation for suspected exposure to other biological agents ruled out: Secondary | ICD-10-CM | POA: Diagnosis not present

## 2023-03-05 DIAGNOSIS — I2510 Atherosclerotic heart disease of native coronary artery without angina pectoris: Secondary | ICD-10-CM | POA: Diagnosis not present

## 2023-03-05 DIAGNOSIS — I214 Non-ST elevation (NSTEMI) myocardial infarction: Secondary | ICD-10-CM | POA: Diagnosis not present

## 2023-03-05 DIAGNOSIS — K219 Gastro-esophageal reflux disease without esophagitis: Secondary | ICD-10-CM | POA: Diagnosis not present

## 2023-03-05 DIAGNOSIS — E1165 Type 2 diabetes mellitus with hyperglycemia: Secondary | ICD-10-CM | POA: Diagnosis not present

## 2023-03-05 DIAGNOSIS — E7800 Pure hypercholesterolemia, unspecified: Secondary | ICD-10-CM | POA: Diagnosis not present

## 2023-03-05 DIAGNOSIS — I10 Essential (primary) hypertension: Secondary | ICD-10-CM | POA: Diagnosis not present

## 2023-03-26 DIAGNOSIS — R7989 Other specified abnormal findings of blood chemistry: Secondary | ICD-10-CM | POA: Diagnosis not present

## 2023-03-26 DIAGNOSIS — E1165 Type 2 diabetes mellitus with hyperglycemia: Secondary | ICD-10-CM | POA: Diagnosis not present

## 2023-03-26 DIAGNOSIS — C61 Malignant neoplasm of prostate: Secondary | ICD-10-CM | POA: Diagnosis not present

## 2023-03-26 DIAGNOSIS — M170 Bilateral primary osteoarthritis of knee: Secondary | ICD-10-CM | POA: Diagnosis not present

## 2023-03-26 DIAGNOSIS — M25473 Effusion, unspecified ankle: Secondary | ICD-10-CM | POA: Diagnosis not present

## 2023-03-26 DIAGNOSIS — Z0000 Encounter for general adult medical examination without abnormal findings: Secondary | ICD-10-CM | POA: Diagnosis not present

## 2023-03-26 DIAGNOSIS — B0229 Other postherpetic nervous system involvement: Secondary | ICD-10-CM | POA: Diagnosis not present

## 2023-03-26 DIAGNOSIS — K219 Gastro-esophageal reflux disease without esophagitis: Secondary | ICD-10-CM | POA: Diagnosis not present

## 2023-03-26 DIAGNOSIS — I10 Essential (primary) hypertension: Secondary | ICD-10-CM | POA: Diagnosis not present

## 2023-04-02 DIAGNOSIS — M25561 Pain in right knee: Secondary | ICD-10-CM | POA: Diagnosis not present

## 2023-04-02 DIAGNOSIS — M79672 Pain in left foot: Secondary | ICD-10-CM | POA: Diagnosis not present

## 2023-04-02 DIAGNOSIS — Z0000 Encounter for general adult medical examination without abnormal findings: Secondary | ICD-10-CM | POA: Diagnosis not present

## 2023-04-02 DIAGNOSIS — B0229 Other postherpetic nervous system involvement: Secondary | ICD-10-CM | POA: Diagnosis not present

## 2023-04-02 DIAGNOSIS — M79671 Pain in right foot: Secondary | ICD-10-CM | POA: Diagnosis not present

## 2023-04-02 DIAGNOSIS — F17210 Nicotine dependence, cigarettes, uncomplicated: Secondary | ICD-10-CM | POA: Diagnosis not present

## 2023-04-02 DIAGNOSIS — E119 Type 2 diabetes mellitus without complications: Secondary | ICD-10-CM | POA: Diagnosis not present

## 2023-04-02 DIAGNOSIS — K219 Gastro-esophageal reflux disease without esophagitis: Secondary | ICD-10-CM | POA: Diagnosis not present

## 2023-04-20 DIAGNOSIS — H2511 Age-related nuclear cataract, right eye: Secondary | ICD-10-CM | POA: Diagnosis not present

## 2023-04-20 DIAGNOSIS — E119 Type 2 diabetes mellitus without complications: Secondary | ICD-10-CM | POA: Diagnosis not present

## 2023-04-20 DIAGNOSIS — H40003 Preglaucoma, unspecified, bilateral: Secondary | ICD-10-CM | POA: Diagnosis not present

## 2023-04-20 DIAGNOSIS — H2513 Age-related nuclear cataract, bilateral: Secondary | ICD-10-CM | POA: Diagnosis not present

## 2023-04-20 DIAGNOSIS — H2512 Age-related nuclear cataract, left eye: Secondary | ICD-10-CM | POA: Diagnosis not present

## 2023-04-25 ENCOUNTER — Other Ambulatory Visit: Payer: Self-pay | Admitting: *Deleted

## 2023-04-25 DIAGNOSIS — C61 Malignant neoplasm of prostate: Secondary | ICD-10-CM

## 2023-04-26 ENCOUNTER — Other Ambulatory Visit: Payer: Self-pay

## 2023-04-26 DIAGNOSIS — C61 Malignant neoplasm of prostate: Secondary | ICD-10-CM | POA: Diagnosis not present

## 2023-04-27 LAB — PSA: Prostate Specific Ag, Serum: 0.1 ng/mL (ref 0.0–4.0)

## 2023-05-04 ENCOUNTER — Ambulatory Visit: Payer: Medicare HMO | Admitting: Urology

## 2023-05-14 NOTE — Progress Notes (Signed)
 05/15/23 1:54 PM   [employer] [birth year] [id number]  Referring provider:  Antonio Baumgarten, MD [address] [employer] [place],  [place] [address]  Urological history  1. Prostate cancer -PSA (04/2022) 0.1 -diagnosed with stage I Gleason 3+4 prostate cancer involving 1 out of 6 core biopsies in 10/2013 .  His presenting PSA was 3.7 -underwent treatment with external beam radiation -released from care 06/2019   2. BPH with LU TS -tamsulosin  0.4 mg daily    3. ED -failed PDE5i's -not interested in other treatment modalities    HPI: Ricky Avery is a 68 y.o. man who presents today for a 12 month follow-up for prostate cancer.   Previous records reviewed.     I PSS 1/2  He has no urinary complaints.  He is taking tamsulosin  0.4 mg daily.  Patient denies any modifying or aggravating factors.  Patient denies any recent UTI's, gross hematuria, dysuria or suprapubic/flank pain.  Patient denies any fevers, chills, nausea or vomiting.     IPSS     Row Name 05/15/23 1300         International Prostate Symptom Score   How often have you had the sensation of not emptying your bladder? Not at All     How often have you had to urinate less than every two hours? Not at All     How often have you found you stopped and started again several times when you urinated? Not at All     How often have you found it difficult to postpone urination? Not at All     How often have you had a weak urinary stream? Not at All     How often have you had to strain to start urination? Not at All     How many times did you typically get up at night to urinate? 1 Time     Total IPSS Score 1       Quality of Life due to urinary symptoms   If you were to spend the rest of your life with your urinary condition just the way it is now how would you feel about that? Pleased                Score:  1-7 Mild 8-19 Moderate 20-35 Severe  SHIM 12  He was having good  response with PDE 5 inhibitors, but unfortunately he could not tolerate side effects.    He is not having spontaneous erections.  He denied any pain with erections or curvature with erections.   SHIM     Row Name 05/15/23 1332         SHIM: Over the last 6 months:   How do you rate your confidence that you could get and keep an erection? Moderate     When you had erections with sexual stimulation, how often were your erections hard enough for penetration (entering your partner)? Almost Never or Never     During sexual intercourse, how often were you able to maintain your erection after you had penetrated (entered) your partner? Almost Never or Never     During sexual intercourse, how difficult was it to maintain your erection to completion of intercourse? Not Difficult     When you attempted sexual intercourse, how often was it satisfactory for you? A Few Times (much less than half the time)       SHIM Total Score   SHIM 12  Score: 1-7 Severe ED 8-11 Moderate ED 12-16 Mild-Moderate ED 17-21 Mild ED 22-25 No ED    PMH: Past Medical History:  Diagnosis Date   Hypercholesteremia    Hypertension    Prostate cancer [facility])     Surgical History: Past Surgical History:  Procedure Laterality Date   CARDIAC CATHETERIZATION      Home Medications:  Allergies as of 05/15/2023       Reactions   No Known Allergies    Cialis  [tadalafil ] Palpitations   He states when he took the Cialis  he experienced an increase in his heart rate and saw spots        Medication List        Accurate as of May 15, 2023  1:54 PM. If you have any questions, ask your nurse or doctor.          STOP taking these medications    lisinopril  40 MG tablet Commonly known as: ZESTRIL    meloxicam 15 MG tablet Commonly known as: MOBIC   nystatin  cream Commonly known as: MYCOSTATIN    Pfizer-BioNT COVID-19 Vac-TriS Susp injection Generic drug: COVID-19 mRNA Vac-TriS Proofreader)    simvastatin  40 MG tablet Commonly known as: ZOCOR    tadalafil  20 MG tablet Commonly known as: CIALIS    topiramate  25 MG tablet Commonly known as: TOPAMAX        TAKE these medications    amLODipine-olmesartan 5-40 MG tablet Commonly known as: AZOR Take 1 tablet by mouth daily.   aspirin 81 MG tablet Take 81 mg by mouth daily.   atorvastatin 40 MG tablet Commonly known as: LIPITOR Take 40 mg by mouth daily.   metFORMIN  500 MG tablet Commonly known as: GLUCOPHAGE  Take 1 tablet (500 mg total) by mouth 2 (two) times daily with meals   metoprolol  succinate 25 MG 24 hr tablet Commonly known as: TOPROL -XL Take 1 tablet (25 mg total) by mouth once daily What changed: Another medication with the same name was removed. Continue taking this medication, and follow the directions you see here.   omeprazole 20 MG capsule Commonly known as: PRILOSEC Take 20 mg by mouth daily.   Potassium 99 MG Tabs Take by mouth.   tamsulosin  0.4 MG Caps capsule Commonly known as: FLOMAX  Take 0.4 mg by mouth daily.   Vitamin D (Ergocalciferol) 1.25 MG (50000 UNIT) Caps capsule Commonly known as: DRISDOL Take by mouth.        Allergies:  Allergies  Allergen Reactions   No Known Allergies    Cialis  [Tadalafil ] Palpitations    He states when he took the Cialis  he experienced an increase in his heart rate and saw spots    Family History: Family History  Problem Relation Age of Onset   Kidney cancer Neg Hx    Prostate cancer Neg Hx    Bladder Cancer Neg Hx     Social History:  reports that he has quit smoking. He has never used smokeless tobacco. He reports current alcohol use. He reports that he does not use drugs.   Physical Exam: BP 121/77   Pulse 65   Ht 5\' 6"  (1.676 m)   Wt 158 lb (71.7 kg)   BMI 25.50 kg/m   Constitutional:  Well nourished. Alert and oriented, No acute distress. HEENT: [place] AT, moist mucus membranes.  Trachea midline, no masses. Cardiovascular: No  clubbing, cyanosis, or edema. Respiratory: Normal respiratory effort, no increased work of breathing. GU: No CVA tenderness.  No bladder fullness or masses.  Patient with  uncircumcised phallus.  Foreskin easily retracted.  Urethral meatus is patent.  No penile discharge. No penile lesions or rashes. Scrotum without lesions, cysts, rashes and/or edema.  Testicles are located scrotally bilaterally. No masses are appreciated in the testicles. Left and right epididymis are normal. Rectal: Patient with  normal sphincter tone. Anus and perineum without scarring or rashes. No rectal masses are appreciated. Prostate is approximately 30 grams, flat, no nodules are appreciated. Seminal vesicles could not be palpated.  Neurologic: Grossly intact, no focal deficits, moving all 4 extremities. Psychiatric: Normal mood and affect.   Laboratory Data: Results for orders placed or performed in visit on 04/26/23  PSA   Collection Time: 04/26/23  1:10 PM  Result Value Ref Range   Prostate Specific Ag, Serum 0.1 0.0 - 4.0 ng/mL    CBC w/auto Differential (5 Part) Order: [id number] Component Ref Range & Units 1 mo ago  WBC (White Blood Cell Count) 4.1 - 10.2 10^3/uL 5.4  RBC (Red Blood Cell Count) 4.69 - 6.13 10^6/uL 4.68 Low   Hemoglobin 14.1 - 18.1 gm/dL 91.4  Hematocrit 78.2 - 52.0 % 42.3  MCV (Mean Corpuscular Volume) 80.0 - 100.0 fl 90.4  MCH (Mean Corpuscular Hemoglobin) 27.0 - 31.2 pg 31.8 High   MCHC (Mean Corpuscular Hemoglobin Concentration) 32.0 - 36.0 gm/dL 95.6  Platelet Count 213 - 450 10^3/uL 177  RDW-CV (Red Cell Distribution Width) 11.6 - 14.8 % 13.2  MPV (Mean Platelet Volume) 9.4 - 12.4 fl 11.4  Neutrophils 1.50 - 7.80 10^3/uL 2.64  Lymphocytes 1.00 - 3.60 10^3/uL 1.96  Monocytes 0.00 - 1.50 10^3/uL 0.45  Eosinophils 0.00 - 0.55 10^3/uL 0.3  Basophils 0.00 - 0.09 10^3/uL 0.04  Neutrophil % 32.0 - 70.0 % 48.9  Lymphocyte % 10.0 - 50.0 % 36.3  Monocyte % 4.0 - 13.0 %  8.3  Eosinophil % 1.0 - 5.0 % 5.6 High   Basophil% 0.0 - 2.0 % 0.7  Immature Granulocyte % <=0.7 % 0.2  Immature Granulocyte Count <=0.06 10^3/L 0.01  Resulting Agency [facility]   Specimen Collected: 03/26/23 07:28   Performed by: [facility] Last Resulted: 03/26/23 08:58  Received From: [facility]  Result Received: 04/26/23 12:58   Comprehensive Metabolic Panel (CMP) Order: [id number] Component Ref Range & Units 1 mo ago  Glucose 70 - 110 mg/dL 629 High   Sodium 528 - 145 mmol/L 142  Potassium 3.6 - 5.1 mmol/L 4.8  Chloride 97 - 109 mmol/L 106  Carbon Dioxide (CO2) 22.0 - 32.0 mmol/L 26.4  Urea Nitrogen (BUN) 7 - 25 mg/dL 12  Creatinine 0.7 - 1.3 mg/dL 0.6 Low   Glomerular Filtration Rate (eGFR) >60 mL/min/1.73sq m 106  Comment: CKD-EPI (2021) does not include patient's race in the calculation of eGFR.  Monitoring changes of plasma creatinine and eGFR over time is useful for monitoring kidney function.  Interpretive Ranges for eGFR (CKD-EPI 2021):  eGFR:       >60 mL/min/1.73 sq. m - Normal eGFR:       30-59 mL/min/1.73 sq. m - Moderately Decreased eGFR:       15-29 mL/min/1.73 sq. m  - Severely Decreased eGFR:       < 15 mL/min/1.73 sq. m  - Kidney Failure   Note: These eGFR calculations do not apply in acute situations when eGFR is changing rapidly or patients on dialysis.  Calcium 8.7 - 10.3 mg/dL 9.4  AST 8 - 39 U/L 18  ALT  6 - 57 U/L 16  Alk Phos (alkaline Phosphatase) 34 - 104 U/L 75  Albumin 3.5 - 4.8 g/dL 4.7  Bilirubin, Total 0.3 - 1.2 mg/dL 0.8  Protein, Total 6.1 - 7.9 g/dL 7  A/G Ratio 1.0 - 5.0 gm/dL 2  Resulting Agency [facility]   Specimen Collected: 03/26/23 07:28   Performed by: [facility] Last Resulted: 03/26/23 17:05  Received From: [facility]  Result Received: 04/26/23 12:58   Hemoglobin A1C Order: [id number] Component Ref  Range & Units 1 mo ago  Hemoglobin A1C 4.2 - 5.6 % 6.9 High   Average Blood Glucose (Calc) mg/dL 034  Resulting Agency [facility]  Narrative Performed by [facility] Normal Range:    4.2 - 5.6% Increased Risk:  5.7 - 6.4% Diabetes:        >= 6.5% Glycemic Control for adults with diabetes:  <7%    Specimen Collected: 03/26/23 07:28   Performed by: [facility] Last Resulted: 03/26/23 09:50  Received From: [facility]  Result Received: 04/26/23 12:58   Lipid Panel w/calc LDL Order: [id number] Component Ref Range & Units 1 mo ago  Cholesterol, Total 100 - 200 mg/dL 756  Triglyceride 35 - 199 mg/dL 97  HDL (High Density Lipoprotein) Cholesterol 29.0 - 71.0 mg/dL 43.3  LDL Calculated 0 - 130 mg/dL 59  VLDL Cholesterol mg/dL 19  Cholesterol/HDL Ratio 3.1  Resulting Agency [facility]   Specimen Collected: 03/26/23 07:28   Performed by: [facility] Last Resulted: 03/26/23 17:05  Received From: [facility]  Result Received: 04/26/23 12:58    Thyroid  Stimulating-Hormone (TSH) Order: [id number] Component Ref Range & Units 1 mo ago  Thyroid  Stimulating Hormone (TSH) 0.450-5.330 uIU/ml uIU/mL 1.686  Resulting Agency [facility] - [facility]   Specimen Collected: 03/26/23 07:28   Performed by: [facility] Last Resulted: 03/26/23 17:05  Received From: [facility]  Result Received: 04/26/23 12:58   Urinalysis w/Microscopic Order: [id number] Component Ref Range & Units 1 mo ago  Color Colorless, Straw, Light Yellow, Yellow, Dark Yellow Light Yellow  Clarity Clear Clear  Specific Gravity 1.005 - 1.030 1.016  pH, Urine 5.0 - 8.0 5.5  Protein, Urinalysis Negative mg/dL Negative  Glucose, Urinalysis Negative mg/dL Negative  Ketones, Urinalysis Negative mg/dL Negative  Blood, Urinalysis Negative Negative  Nitrite,  Urinalysis Negative Negative  Leukocyte Esterase, Urinalysis Negative Negative  Bilirubin, Urinalysis Negative Negative  Urobilinogen, Urinalysis 0.2 - 1.0 mg/dL 0.2  WBC, UA <=5 /hpf <1  Red Blood Cells, Urinalysis <=3 /hpf 1  Bacteria, Urinalysis 0 - 5 /hpf 0-5  Squamous Epithelial Cells, Urinalysis /hpf 0  Resulting Agency [facility]   Specimen Collected: 03/26/23 07:28   Performed by: [facility] Last Resulted: 03/26/23 08:33  Received From: [facility]  Result Received: 04/26/23 12:58  I have reviewed the labs.   Pertinent Imaging: N/A  Assessment & Plan:    Prostate cancer  - PSA demonstrates biochemical control  2. BPH with LUTS  - Continue conservative management, avoiding bladder irritants and timed voiding's - Continue tamsulosin  0.4 mg daily  3. Erectile dysfunction - Could not tolerate PDE 5 inhibitors, but he states he and his wife are satisfied   Return in about 1 year (around 05/14/2024) for PSA, I PSS  and exam .  Matilde Son, PA-C   [facility] [address] [place], [place] [address] [phone]

## 2023-05-15 ENCOUNTER — Ambulatory Visit (INDEPENDENT_AMBULATORY_CARE_PROVIDER_SITE_OTHER): Payer: Medicare HMO | Admitting: Urology

## 2023-05-15 ENCOUNTER — Encounter: Payer: Self-pay | Admitting: Urology

## 2023-05-15 VITALS — BP 121/77 | HR 65 | Ht 66.0 in | Wt 158.0 lb

## 2023-05-15 DIAGNOSIS — N529 Male erectile dysfunction, unspecified: Secondary | ICD-10-CM | POA: Diagnosis not present

## 2023-05-15 DIAGNOSIS — N401 Enlarged prostate with lower urinary tract symptoms: Secondary | ICD-10-CM

## 2023-05-15 DIAGNOSIS — N138 Other obstructive and reflux uropathy: Secondary | ICD-10-CM

## 2023-05-15 DIAGNOSIS — C61 Malignant neoplasm of prostate: Secondary | ICD-10-CM | POA: Diagnosis not present

## 2023-05-22 DIAGNOSIS — H524 Presbyopia: Secondary | ICD-10-CM | POA: Diagnosis not present

## 2024-05-07 ENCOUNTER — Other Ambulatory Visit

## 2024-05-14 ENCOUNTER — Ambulatory Visit: Admitting: Urology
